# Patient Record
Sex: Male | Born: 1950 | Race: Black or African American | Hispanic: No | Marital: Married | Smoking: Current every day smoker
Health system: Southern US, Community
[De-identification: ages and names within clinical notes are randomized; demographics above are authoritative.]

## PROBLEM LIST (undated history)

## (undated) DIAGNOSIS — I10 Essential (primary) hypertension: Secondary | ICD-10-CM

## (undated) HISTORY — PX: FINGER SURGERY: SHX640

---

## 2011-10-22 ENCOUNTER — Encounter: Payer: Self-pay | Admitting: *Deleted

## 2011-10-22 ENCOUNTER — Emergency Department (HOSPITAL_COMMUNITY)
Admission: EM | Admit: 2011-10-22 | Discharge: 2011-10-23 | Disposition: A | Discharge status: Home / Self Care | Payer: Self-pay | Attending: Emergency Medicine | Admitting: Emergency Medicine

## 2011-10-22 DIAGNOSIS — F17200 Nicotine dependence, unspecified, uncomplicated: Secondary | ICD-10-CM | POA: Insufficient documentation

## 2011-10-22 DIAGNOSIS — R059 Cough, unspecified: Secondary | ICD-10-CM | POA: Insufficient documentation

## 2011-10-22 DIAGNOSIS — G43909 Migraine, unspecified, not intractable, without status migrainosus: Secondary | ICD-10-CM | POA: Insufficient documentation

## 2011-10-22 DIAGNOSIS — R05 Cough: Secondary | ICD-10-CM | POA: Insufficient documentation

## 2011-10-22 MED ORDER — PROMETHAZINE HCL 25 MG/ML IJ SOLN
12.5000 mg | Freq: Once | INTRAMUSCULAR | Status: AC
  Administered 2011-10-23: 12.5 mg via INTRAVENOUS
  Filled 2011-10-22: qty 1

## 2011-10-22 MED ORDER — SODIUM CHLORIDE 0.9 % IV SOLN: INTRAVENOUS | Status: DC

## 2011-10-22 MED ORDER — DEXAMETHASONE SODIUM PHOSPHATE 4 MG/ML IJ SOLN
10.0000 mg | Freq: Once | INTRAMUSCULAR | Status: AC
  Administered 2011-10-23: 10 mg via INTRAVENOUS
  Filled 2011-10-22: qty 3

## 2011-10-22 MED ORDER — DIPHENHYDRAMINE HCL 50 MG/ML IJ SOLN
25.0000 mg | Freq: Once | INTRAMUSCULAR | Status: AC
  Administered 2011-10-23: 25 mg via INTRAVENOUS
  Filled 2011-10-22: qty 1

## 2011-10-22 MED ORDER — SODIUM CHLORIDE 0.9 % IV BOLUS (SEPSIS)
1000.0000 mL | Freq: Once | INTRAVENOUS | Status: AC
  Administered 2011-10-23: 1000 mL via INTRAVENOUS

## 2011-10-22 NOTE — ED Notes (Signed)
Pt reports migraine headache on left side.  Reports headache started about 3 weeks ago, pain is intermittent.  Reports occasional blurred vision in left eye. Denies nausea or vomiting at present time.

## 2011-10-23 ENCOUNTER — Encounter (HOSPITAL_COMMUNITY): Payer: Self-pay | Admitting: Emergency Medicine

## 2011-10-23 NOTE — ED Provider Notes (Signed)
History     CSN: [id number] Arrival date & time: 10/22/2011 10:58 PM   First MD Initiated Contact with Patient 10/22/11 2313      Chief Complaint  Patient presents with  . Migraine    (Consider location/radiation/quality/duration/timing/severity/associated sxs/prior treatment) Patient is a 60 y.o. male presenting with migraine. The history is provided by the patient and the spouse.  Migraine This is a recurrent problem. The current episode started 3 to 5 hours ago. The problem occurs constantly. The problem has been resolved. Associated symptoms include headaches. Pertinent negatives include no chest pain, no abdominal pain and no shortness of breath. The symptoms are aggravated by nothing. The symptoms are relieved by nothing. Treatments tried: Excedrin Migraine. The treatment provided significant relief.   Patient with known history of migraines but having trouble recurrent migraines for the past 3 weeks today onset of migraine at 8:30 in the evening. Upon arrival to the emergency department headache resolved after taking his Excedrin Migraine. The headache is left temporal area with pain to the left thigh this is typical no nausea no vomiting. He is also on Imitrex he is followed in the [place] area by his doctor for headache. No other complaints  Past Medical History  Diagnosis Date  . Migraine     History reviewed. No pertinent past surgical history.  History reviewed. No pertinent family history.  History  Substance Use Topics  . Smoking status: Current Everyday Smoker    Types: Cigars  . Smokeless tobacco: Not on file  . Alcohol Use: No      Review of Systems  Constitutional: Negative for fever and chills.  HENT: Negative for congestion and neck pain.   Eyes: Positive for pain. Negative for photophobia, redness and visual disturbance.  Respiratory: Positive for cough. Negative for shortness of breath.   Cardiovascular: Negative for chest pain.  Gastrointestinal:  Negative for nausea, vomiting, abdominal pain and diarrhea.  Genitourinary: Negative for dysuria.  Musculoskeletal: Negative for back pain.  Neurological: Positive for headaches. Negative for facial asymmetry, speech difficulty, weakness and numbness.  Hematological: Does not bruise/bleed easily.  Psychiatric/Behavioral: Negative for confusion.    Allergies  Vancomycin  Home Medications   Current Outpatient Rx  Name Route Sig Dispense Refill  . ASPIRIN-ACETAMINOPHEN-CAFFEINE 250-250-65 MG PO TABS Oral Take 2 tablets by mouth as needed. For migraine pain     . SUMATRIPTAN SUCCINATE 50 MG PO TABS Oral Take 50 mg by mouth every 2 (two) hours as needed. For pain       BP 139/77  Pulse 52  Temp(Src) 98.3 F (36.8 C) (Oral)  Resp 16  Ht 5\' 10"  (1.778 m)  Wt 185 lb (83.915 kg)  BMI 26.54 kg/m2  SpO2 100%  Physical Exam  Nursing note and vitals reviewed. Constitutional: He is oriented to person, place, and time. He appears well-developed and well-nourished. No distress.  HENT:  Head: Normocephalic and atraumatic.  Mouth/Throat: Oropharynx is clear and moist.  Eyes: Conjunctivae and EOM are normal. Pupils are equal, round, and reactive to light.  Neck: Neck supple.  Cardiovascular: Normal rate, regular rhythm and normal heart sounds.   No murmur heard. Pulmonary/Chest: Effort normal.  Abdominal: Soft. Bowel sounds are normal. There is no tenderness.  Musculoskeletal: Normal range of motion.  Neurological: He is alert and oriented to person, place, and time. No cranial nerve deficit. He exhibits normal muscle tone. Coordination normal.  Skin: Skin is warm. No rash noted.    ED Course  Procedures (including  critical care time)  Labs Reviewed - No data to display No results found.   1. Migraine       MDM   Left-sided headache typical for this patient's migraine headaches they actually had resolved shortly before I got in to see him but he says they have been recurring  on and off all day so we decided to treat him with migraine cocktail anyways. The patient was given IV Decadron IV Benadryl IV Phenergan and one bolus of IV normal saline. Remained headache free and get some sleep. Patient is from the [place] area and is followed by his physician therefore his ha.         Shelda Jakes, MD 10/23/11 [phone]

## 2016-01-05 ENCOUNTER — Encounter (HOSPITAL_COMMUNITY): Payer: Self-pay

## 2016-01-05 ENCOUNTER — Emergency Department (HOSPITAL_COMMUNITY)
Admission: EM | Admit: 2016-01-05 | Discharge: 2016-01-05 | Disposition: A | Discharge status: Home / Self Care | Payer: Medicare Other | Attending: Emergency Medicine | Admitting: Emergency Medicine

## 2016-01-05 DIAGNOSIS — Z87891 Personal history of nicotine dependence: Secondary | ICD-10-CM | POA: Diagnosis not present

## 2016-01-05 DIAGNOSIS — G43009 Migraine without aura, not intractable, without status migrainosus: Secondary | ICD-10-CM | POA: Insufficient documentation

## 2016-01-05 DIAGNOSIS — Z79899 Other long term (current) drug therapy: Secondary | ICD-10-CM | POA: Insufficient documentation

## 2016-01-05 DIAGNOSIS — R51 Headache: Secondary | ICD-10-CM | POA: Diagnosis present

## 2016-01-05 LAB — I-STAT CHEM 8, ED
BUN: 11 mg/dL (ref 6–20)
CREATININE: 0.9 mg/dL (ref 0.61–1.24)
Calcium, Ion: 1.12 mmol/L — ABNORMAL LOW (ref 1.13–1.30)
Chloride: 104 mmol/L (ref 101–111)
Glucose, Bld: 108 mg/dL — ABNORMAL HIGH (ref 65–99)
HEMATOCRIT: 45 % (ref 39.0–52.0)
HEMOGLOBIN: 15.3 g/dL (ref 13.0–17.0)
POTASSIUM: 3.7 mmol/L (ref 3.5–5.1)
SODIUM: 141 mmol/L (ref 135–145)
TCO2: 24 mmol/L (ref 0–100)

## 2016-01-05 LAB — C-REACTIVE PROTEIN: CRP: <0.5 mg/dL (ref <1.0)

## 2016-01-05 LAB — SEDIMENTATION RATE: Sed Rate: 4 mm/hr (ref 0–16)

## 2016-01-05 MED ORDER — DEXAMETHASONE SODIUM PHOSPHATE 10 MG/ML IJ SOLN
10.0000 mg | Freq: Once | INTRAMUSCULAR | Status: AC
  Administered 2016-01-05: 10 mg via INTRAVENOUS
  Filled 2016-01-05: qty 1

## 2016-01-05 MED ORDER — DIPHENHYDRAMINE HCL 50 MG/ML IJ SOLN
25.0000 mg | Freq: Once | INTRAMUSCULAR | Status: AC
  Administered 2016-01-05: 25 mg via INTRAVENOUS
  Filled 2016-01-05: qty 1

## 2016-01-05 MED ORDER — PROCHLORPERAZINE EDISYLATE 5 MG/ML IJ SOLN
10.0000 mg | Freq: Once | INTRAMUSCULAR | Status: AC
  Administered 2016-01-05: 10 mg via INTRAVENOUS
  Filled 2016-01-05: qty 2

## 2016-01-05 MED ORDER — SODIUM CHLORIDE 0.9 % IV BOLUS (SEPSIS)
1000.0000 mL | Freq: Once | INTRAVENOUS | Status: AC
  Administered 2016-01-05: 1000 mL via INTRAVENOUS

## 2016-01-05 NOTE — ED Notes (Signed)
Pt reports headache onset last night and continuing this am, taking excedrin migraine without relief

## 2016-01-05 NOTE — ED Provider Notes (Signed)
CSN: [id number]     Arrival date & time 01/05/16  0415 History   First MD Initiated Contact with Patient 01/05/16 0425     Chief Complaint  Patient presents with  . Headache     (Consider location/radiation/quality/duration/timing/severity/associated sxs/prior Treatment) HPI  This is a 65 year old male who presents with headache. Patient reports headache worsening over the last 24 hours. He reports history of migraines "that I get every few years." He states that he has had a headache on and off since the beginning of February. Current headache starting yesterday. It is over the left temple. Currently 9 out of 10. Denies fever, neck pain, worst headache of his life. It is similar to prior headaches. He saw his primary physician but "he didn't do anything for me." He has been taking Excedrin migraine with minimal relief. Reports occasional blurred vision of the left eye. Reports photophobia. No nausea or vomiting.  Past Medical History  Diagnosis Date  . Migraine    History reviewed. No pertinent past surgical history. No family history on file. Social History  Substance Use Topics  . Smoking status: Former Smoker    Types: Cigars  . Smokeless tobacco: None  . Alcohol Use: No    Review of Systems  Constitutional: Negative for fever.  Eyes: Positive for photophobia and visual disturbance.  Gastrointestinal: Negative for nausea and vomiting.  Musculoskeletal: Negative for neck pain and neck stiffness.  Neurological: Positive for headaches.  All other systems reviewed and are negative.     Allergies  Vancomycin  Home Medications   Prior to Admission medications   Medication Sig Start Date End Date Taking? Authorizing Provider  aspirin-acetaminophen-caffeine (EXCEDRIN MIGRAINE) [phone] MG per tablet Take 2 tablets by mouth as needed. For migraine pain    Yes Historical Provider, MD  atorvastatin (LIPITOR) 40 MG tablet Take 40 mg by mouth daily.   Yes Historical Provider,  MD  lisinopril (PRINIVIL,ZESTRIL) 20 MG tablet Take 20 mg by mouth daily.   Yes Historical Provider, MD  SUMAtriptan (IMITREX) 50 MG tablet Take 50 mg by mouth every 2 (two) hours as needed. For pain     Historical Provider, MD   BP 137/81 mmHg  Pulse 87  Temp(Src) 97.4 F (36.3 C) (Oral)  Resp 20  Ht  (1.778 m)  Wt 182 lb (82.555 kg)  BMI 26.11 kg/m2  SpO2 97% Physical Exam  Constitutional: He is oriented to person, place, and time. He appears well-developed and well-nourished. No distress.  HENT:  Head: Normocephalic and atraumatic.  Mouth/Throat: Oropharynx is clear and moist.  Tenderness to palpation left temple  Eyes: EOM are normal. Pupils are equal, round, and reactive to light.  Neck: Neck supple.  Cardiovascular: Normal rate, regular rhythm and normal heart sounds.   No murmur heard. Pulmonary/Chest: Effort normal and breath sounds normal. No respiratory distress. He has no wheezes.  Musculoskeletal: He exhibits no edema.  Neurological: He is alert and oriented to person, place, and time.  Cranial nerves II through XII intact, 5 out of 5 strength in all 4's extremities, normal gait  Skin: Skin is warm and dry.  Psychiatric: He has a normal mood and affect.  Nursing note and vitals reviewed.   ED Course  Procedures (including critical care time) Labs Review Labs Reviewed  I-STAT CHEM 8, ED - Abnormal; Notable for the following:    Glucose, Bld 108 (*)    Calcium, Ion 1.12 (*)    All other components within normal limits  SEDIMENTATION RATE  C-REACTIVE PROTEIN    Imaging Review No results found. I have personally reviewed and evaluated these images and lab results as part of my medical decision-making.   EKG Interpretation None      MDM   Final diagnoses:  Migraine without aura and without status migrainosus, not intractable    Patient presents with headache. History of migraine headaches. Headache is refractory to outpatient medications. Of  note, patient reports most of his pain is over his left temporal and he does have tenderness to palpation there. Given his age, temporal arteritis would also be a consideration. Doubt meningitis or subarachnoid hemorrhage. Patient was given migraine cocktail. Lab work including sedimentation rate and CRP were obtained. Chem-8 and sedimentation rate are essentially normal. On recheck, patient reports marked improvement of his pain with a migraine cocktail. He is requesting discharge home. With a normal sedimentation rate, doubt temporal arteritis. We'll have him follow-up with his primary physician.  After history, exam, and medical workup I feel the patient has been appropriately medically screened and is safe for discharge home. Pertinent diagnoses were discussed with the patient. Patient was given return precautions.     Courtney Shon Baton2/24/17 [phone]

## 2016-01-05 NOTE — Discharge Instructions (Signed)

## 2016-06-30 ENCOUNTER — Emergency Department (HOSPITAL_COMMUNITY)
Admission: EM | Admit: 2016-06-30 | Discharge: 2016-06-30 | Disposition: A | Discharge status: Home / Self Care | Payer: Medicare Other | Source: Home / Self Care | Attending: Emergency Medicine | Admitting: Emergency Medicine

## 2016-06-30 ENCOUNTER — Encounter (HOSPITAL_COMMUNITY): Payer: Self-pay | Admitting: *Deleted

## 2016-06-30 DIAGNOSIS — R51 Headache: Secondary | ICD-10-CM | POA: Insufficient documentation

## 2016-06-30 DIAGNOSIS — Z79899 Other long term (current) drug therapy: Secondary | ICD-10-CM | POA: Insufficient documentation

## 2016-06-30 DIAGNOSIS — I10 Essential (primary) hypertension: Secondary | ICD-10-CM | POA: Insufficient documentation

## 2016-06-30 DIAGNOSIS — R319 Hematuria, unspecified: Secondary | ICD-10-CM | POA: Insufficient documentation

## 2016-06-30 DIAGNOSIS — R05 Cough: Secondary | ICD-10-CM

## 2016-06-30 DIAGNOSIS — Z87891 Personal history of nicotine dependence: Secondary | ICD-10-CM | POA: Insufficient documentation

## 2016-06-30 DIAGNOSIS — Z79891 Long term (current) use of opiate analgesic: Secondary | ICD-10-CM

## 2016-06-30 DIAGNOSIS — R509 Fever, unspecified: Secondary | ICD-10-CM

## 2016-06-30 LAB — COMPREHENSIVE METABOLIC PANEL
ALBUMIN: 3.4 g/dL — AB (ref 3.5–5.0)
ALT: 88 U/L — ABNORMAL HIGH (ref 17–63)
ANION GAP: 6 (ref 5–15)
AST: 145 U/L — ABNORMAL HIGH (ref 15–41)
Alkaline Phosphatase: 135 U/L — ABNORMAL HIGH (ref 38–126)
BILIRUBIN TOTAL: 0.7 mg/dL (ref 0.3–1.2)
BUN: 12 mg/dL (ref 6–20)
CO2: 24 mmol/L (ref 22–32)
Calcium: 8.3 mg/dL — ABNORMAL LOW (ref 8.9–10.3)
Chloride: 99 mmol/L — ABNORMAL LOW (ref 101–111)
Creatinine, Ser: 1.01 mg/dL (ref 0.61–1.24)
Glucose, Bld: 185 mg/dL — ABNORMAL HIGH (ref 65–99)
POTASSIUM: 3.4 mmol/L — AB (ref 3.5–5.1)
Sodium: 129 mmol/L — ABNORMAL LOW (ref 135–145)
TOTAL PROTEIN: 6.6 g/dL (ref 6.5–8.1)

## 2016-06-30 LAB — URINALYSIS, ROUTINE W REFLEX MICROSCOPIC
GLUCOSE, UA: 100 mg/dL — AB
HGB URINE DIPSTICK: NEGATIVE
KETONES UR: NEGATIVE mg/dL
Leukocytes, UA: NEGATIVE
NITRITE: NEGATIVE
PH: 6 (ref 5.0–8.0)
Protein, ur: 30 mg/dL — AB
Specific Gravity, Urine: 1.015 (ref 1.005–1.030)

## 2016-06-30 LAB — CBC WITH DIFFERENTIAL/PLATELET
BASOS PCT: 0 %
Basophils Absolute: 0 10*3/uL (ref 0.0–0.1)
Eosinophils Absolute: 0 10*3/uL (ref 0.0–0.7)
Eosinophils Relative: 0 %
HEMATOCRIT: 41.2 % (ref 39.0–52.0)
Hemoglobin: 13.8 g/dL (ref 13.0–17.0)
LYMPHS ABS: 0.4 10*3/uL — AB (ref 0.7–4.0)
LYMPHS PCT: 14 %
MCH: 26.4 pg (ref 26.0–34.0)
MCHC: 33.5 g/dL (ref 30.0–36.0)
MCV: 78.8 fL (ref 78.0–100.0)
Monocytes Absolute: 0.1 10*3/uL (ref 0.1–1.0)
Monocytes Relative: 2 %
NEUTROS PCT: 84 %
Neutro Abs: 2.4 10*3/uL (ref 1.7–7.7)
Platelets: 160 10*3/uL (ref 150–400)
RBC: 5.23 MIL/uL (ref 4.22–5.81)
RDW: 14.4 % (ref 11.5–15.5)
WBC: 2.9 10*3/uL — AB (ref 4.0–10.5)

## 2016-06-30 LAB — URINE MICROSCOPIC-ADD ON

## 2016-06-30 MED ORDER — ACETAMINOPHEN 500 MG PO TABS
1000.0000 mg | ORAL_TABLET | Freq: Once | ORAL | Status: AC
  Administered 2016-06-30: 1000 mg via ORAL
  Filled 2016-06-30: qty 2

## 2016-06-30 NOTE — Discharge Instructions (Signed)
Alternate Tylenol and Motrin every 4 hours for fever over 101. Drink plenty of fluids (non alcoholic) Your liver tests were abnormal - likely from drinking alcohol - please reduce the amount that you drink Seek medical attention for severe or worsening symptoms.

## 2016-06-30 NOTE — ED Triage Notes (Signed)
Pt c/o fever x 2 days with right lower groin pain that is aggravated at times when walking; pt denies any n/v/d or urinary sx

## 2016-06-30 NOTE — ED Notes (Signed)
MD at bedside. 

## 2016-06-30 NOTE — ED Triage Notes (Signed)
Pt was seen at [facility] on 8-18 and told he has bronchitis and possible kidney stones; pt states he does not feel any better

## 2016-06-30 NOTE — ED Provider Notes (Signed)
[facility]-EMERGENCY DEPT Provider Note   CSN: [id number] Arrival date & time: 06/30/16  0605     History   Chief Complaint Chief Complaint  Patient presents with  . Fever    HPI Kevin Odom is a 65 y.o. male.  The patient is a 65 year old male, he has a history of hypertension and hypercholesterolemia, he states that he has been sick for several days with fever, occasional coughing and some lower right abdominal pain for which she was seen at [facility], he reports that while he was there he had x-rays, CT scan, blood work and was told that he had bronchitis and a kidney stone. He states that over the last 3 days since his symptoms started he has had persistent fluctuating fever, occasional coughing, states that he feels like he is just not getting better despite being prescribed Zithromax for his bronchitis and cough syrup. He denies any pain in his scrotum or testicles, he denies any back pain, he denies any vomiting or diarrhea. He has been able to eat and drink without difficulty. He also reports that his head is hurting when his hair grows in. He states it did not used to feel that way but he does not shave his head anymore and now his scalp hurts to the touch. He denies rashes or redness of his scalp.      Past Medical History:  Diagnosis Date  . Migraine     There are no active problems to display for this patient.   Past Surgical History:  Procedure Laterality Date  . FINGER SURGERY Right        Home Medications    Prior to Admission medications   Medication Sig Start Date End Date Taking? Authorizing Provider  atorvastatin (LIPITOR) 40 MG tablet Take 40 mg by mouth daily.   Yes Historical Provider, MD  azithromycin (ZITHROMAX) 250 MG tablet Take by mouth daily.   Yes Historical Provider, MD  Cholecalciferol (VITAMIN D-3) 1000 units CAPS Take 2 capsules by mouth.   Yes Historical Provider, MD  cyclobenzaprine (FLEXERIL) 10 MG tablet Take 10 mg by  mouth 3 (three) times daily as needed for muscle spasms.   Yes Historical Provider, MD  HYDROcodone-acetaminophen (NORCO/VICODIN) 5-325 MG tablet Take 1 tablet by mouth every 6 (six) hours as needed for moderate pain.   Yes Historical Provider, MD  lisinopril-hydrochlorothiazide (PRINZIDE,ZESTORETIC) 20-12.5 MG tablet Take 1 tablet by mouth daily.   Yes Historical Provider, MD  PROMETHAZINE-DM PO Take 10 mLs by mouth every 6 (six) hours.   Yes Historical Provider, MD  Vitamin D, Ergocalciferol, (DRISDOL) 50000 units CAPS capsule Take 50,000 Units by mouth every 7 (seven) days.   Yes Historical Provider, MD  aspirin-acetaminophen-caffeine (EXCEDRIN MIGRAINE) [phone] MG per tablet Take 2 tablets by mouth as needed. For migraine pain     Historical Provider, MD  SUMAtriptan (IMITREX) 50 MG tablet Take 50 mg by mouth every 2 (two) hours as needed. For pain     Historical Provider, MD    Family History History reviewed. No pertinent family history.  Social History Social History  Substance Use Topics  . Smoking status: Former Smoker    Types: Cigars  . Smokeless tobacco: Never Used  . Alcohol use No     Allergies   Vancomycin   Review of Systems Review of Systems  Constitutional: Positive for fever. Negative for chills.  HENT: Negative for sore throat.   Eyes: Negative for visual disturbance.  Respiratory: Positive for cough.  Negative for shortness of breath.   Cardiovascular: Negative for chest pain.  Gastrointestinal: Negative for abdominal pain, diarrhea, nausea and vomiting.  Genitourinary: Positive for hematuria. Negative for dysuria and frequency.  Musculoskeletal: Negative for back pain and neck pain.  Skin: Negative for rash.  Neurological: Positive for headaches. Negative for weakness and numbness.  Hematological: Negative for adenopathy.  Psychiatric/Behavioral: Negative for behavioral problems.     Physical Exam Updated Vital Signs BP 112/67   Pulse 66   Temp  98.2 F (36.8 C) (Oral)   Resp 16   Ht 5\' 10"  (1.778 m)   Wt 173 lb (78.5 kg)   SpO2 98%   BMI 24.82 kg/m   Physical Exam  Constitutional: He appears well-developed and well-nourished. No distress.  HENT:  Head: Normocephalic and atraumatic.  Mouth/Throat: Oropharynx is clear and moist. No oropharyngeal exudate.  Oropharynx is clear and moist, normal mucous membranes, no exudate asymmetry or hypertrophy, uvula is midline. Normal appearing scalp, no signs of rash, no abscess, no obvious asymmetry or abnormal appearance of the skin, no tenderness to my palpation  Eyes: Conjunctivae and EOM are normal. Pupils are equal, round, and reactive to light. Right eye exhibits no discharge. Left eye exhibits no discharge. No scleral icterus.  Neck: Normal range of motion. Neck supple. No JVD present. No thyromegaly present.  Very supple neck without lymphadenopathy or thyromegaly  Cardiovascular: Normal rate, regular rhythm, normal heart sounds and intact distal pulses.  Exam reveals no gallop and no friction rub.   No murmur heard. No tachycardia, strong peripheral pulses  Pulmonary/Chest: Effort normal and breath sounds normal. No respiratory distress. He has no wheezes. He has no rales.  Abdominal: Soft. Bowel sounds are normal. He exhibits no distension and no mass. There is no tenderness.  No abdominal tenderness to palpation, no masses in the inguinal area  Musculoskeletal: Normal range of motion. He exhibits no edema or tenderness.  Lymphadenopathy:    He has no cervical adenopathy.  Neurological: He is alert. Coordination normal.  Skin: Skin is warm and dry. No rash noted. No erythema.  Psychiatric: He has a normal mood and affect. His behavior is normal.  Nursing note and vitals reviewed.    ED Treatments / Results  Labs (all labs ordered are listed, but only abnormal results are displayed) Labs Reviewed  CBC WITH DIFFERENTIAL/PLATELET - Abnormal; Notable for the following:        Result Value   WBC 2.9 (*)    Lymphs Abs 0.4 (*)    All other components within normal limits  URINALYSIS, ROUTINE W REFLEX MICROSCOPIC (NOT AT [facility]) - Abnormal; Notable for the following:    Color, Urine AMBER (*)    APPearance HAZY (*)    Glucose, UA 100 (*)    Bilirubin Urine SMALL (*)    Protein, ur 30 (*)    All other components within normal limits  COMPREHENSIVE METABOLIC PANEL - Abnormal; Notable for the following:    Sodium 129 (*)    Potassium 3.4 (*)    Chloride 99 (*)    Glucose, Bld 185 (*)    Calcium 8.3 (*)    Albumin 3.4 (*)    AST 145 (*)    ALT 88 (*)    Alkaline Phosphatase 135 (*)    All other components within normal limits  URINE MICROSCOPIC-ADD ON - Abnormal; Notable for the following:    Squamous Epithelial / LPF 0-5 (*)    Bacteria, UA MANY (*)  All other components within normal limits  URINE CULTURE    EKG  EKG Interpretation None       Radiology No results found.  Procedures Procedures (including critical care time)  Medications Ordered in ED Medications  acetaminophen (TYLENOL) tablet 1,000 mg (1,000 mg Oral Given 06/30/16 0717)     Initial Impression / Assessment and Plan / ED Course  I have reviewed the triage vital signs and the nursing notes.  Pertinent labs & imaging results that were available during my care of the patient were reviewed by me and considered in my medical decision making (see chart for details).  Clinical Course  Comment By Time  The patient is well-appearing, he will need to have some basic labs drawn to make sure there is no change in his baseline however his heart rate is 70, temperature is 98.2, blood pressure is 123/68, oxygen of 99% and his lungs are clear on room air. At this time I will obtain his records from the other hospital, will give him Tylenol, check a urinalysis as it does appear to have a dark appearance. We'll need to rule out infection, this could be hematuria from a kidney stone. He has a  full bottle of cough syrup which she has not been using that much of and it seems that he may have a bronchitis, the etiology of this infection is likely viral however imaging will be obtained from the outside hospital to further elucidate the source of that cough. He is well-appearing, he is not hypotensive, there is no signs of systemic inflammatory response syndrome Eber HongBrian Jemarion Roycroft, MD 08/20 [phone]  Pt and family informed of results - he now states that he drinks beer daily - this would explain the transaminitis - pt cautioned about this and encouraged to stop drinking - will seek PCP guidance in this issue - otherwise pt is well appaering, no fever and stabel for d/c. Eber HongBrian Wajiha Versteeg, MD 08/20 [phone]      Final Clinical Impressions(s) / ED Diagnoses   Final diagnoses:  Fever, unspecified fever cause    New Prescriptions New Prescriptions   No medications on file     Eber HongBrian Shamarion Coots, MD 06/30/16 [phone]

## 2016-07-01 ENCOUNTER — Inpatient Hospital Stay (HOSPITAL_COMMUNITY)
Admission: EM | Admit: 2016-07-01 | Discharge: 2016-07-03 | DRG: 640 | Discharge status: Against Medical Advice | Payer: Medicare Other | Attending: Internal Medicine | Admitting: Internal Medicine

## 2016-07-01 ENCOUNTER — Encounter (HOSPITAL_COMMUNITY): Payer: Self-pay | Admitting: Emergency Medicine

## 2016-07-01 ENCOUNTER — Emergency Department (HOSPITAL_COMMUNITY): Payer: Medicare Other

## 2016-07-01 DIAGNOSIS — E876 Hypokalemia: Secondary | ICD-10-CM | POA: Diagnosis present

## 2016-07-01 DIAGNOSIS — E860 Dehydration: Secondary | ICD-10-CM | POA: Diagnosis present

## 2016-07-01 DIAGNOSIS — G9340 Encephalopathy, unspecified: Secondary | ICD-10-CM | POA: Diagnosis present

## 2016-07-01 DIAGNOSIS — I10 Essential (primary) hypertension: Secondary | ICD-10-CM | POA: Diagnosis present

## 2016-07-01 DIAGNOSIS — R748 Abnormal levels of other serum enzymes: Secondary | ICD-10-CM

## 2016-07-01 DIAGNOSIS — Z7982 Long term (current) use of aspirin: Secondary | ICD-10-CM

## 2016-07-01 DIAGNOSIS — R7989 Other specified abnormal findings of blood chemistry: Secondary | ICD-10-CM | POA: Diagnosis not present

## 2016-07-01 DIAGNOSIS — E7800 Pure hypercholesterolemia, unspecified: Secondary | ICD-10-CM | POA: Diagnosis present

## 2016-07-01 DIAGNOSIS — E869 Volume depletion, unspecified: Secondary | ICD-10-CM | POA: Diagnosis present

## 2016-07-01 DIAGNOSIS — Z79891 Long term (current) use of opiate analgesic: Secondary | ICD-10-CM | POA: Diagnosis not present

## 2016-07-01 DIAGNOSIS — R410 Disorientation, unspecified: Secondary | ICD-10-CM | POA: Diagnosis present

## 2016-07-01 DIAGNOSIS — R4182 Altered mental status, unspecified: Secondary | ICD-10-CM | POA: Diagnosis not present

## 2016-07-01 DIAGNOSIS — F1010 Alcohol abuse, uncomplicated: Secondary | ICD-10-CM | POA: Diagnosis present

## 2016-07-01 DIAGNOSIS — R945 Abnormal results of liver function studies: Secondary | ICD-10-CM

## 2016-07-01 DIAGNOSIS — E785 Hyperlipidemia, unspecified: Secondary | ICD-10-CM | POA: Diagnosis present

## 2016-07-01 DIAGNOSIS — E871 Hypo-osmolality and hyponatremia: Principal | ICD-10-CM | POA: Diagnosis present

## 2016-07-01 LAB — HEPATIC FUNCTION PANEL
ALT: 90 U/L — ABNORMAL HIGH (ref 17–63)
AST: 153 U/L — ABNORMAL HIGH (ref 15–41)
Albumin: 3.4 g/dL — ABNORMAL LOW (ref 3.5–5.0)
Alkaline Phosphatase: 252 U/L — ABNORMAL HIGH (ref 38–126)
Bilirubin, Direct: 1.3 mg/dL — ABNORMAL HIGH (ref 0.1–0.5)
Indirect Bilirubin: 0.7 mg/dL (ref 0.3–0.9)
Total Bilirubin: 2 mg/dL — ABNORMAL HIGH (ref 0.3–1.2)
Total Protein: 6.5 g/dL (ref 6.5–8.1)

## 2016-07-01 LAB — NA AND K (SODIUM & POTASSIUM), RAND UR
POTASSIUM UR: 15 mmol/L
Sodium, Ur: 24 mmol/L

## 2016-07-01 LAB — BASIC METABOLIC PANEL
ANION GAP: 6 (ref 5–15)
Anion gap: 5 (ref 5–15)
BUN: 15 mg/dL (ref 6–20)
BUN: 15 mg/dL (ref 6–20)
CALCIUM: 7.6 mg/dL — AB (ref 8.9–10.3)
CO2: 25 mmol/L (ref 22–32)
CO2: 25 mmol/L (ref 22–32)
Calcium: 7.9 mg/dL — ABNORMAL LOW (ref 8.9–10.3)
Chloride: 94 mmol/L — ABNORMAL LOW (ref 101–111)
Chloride: 94 mmol/L — ABNORMAL LOW (ref 101–111)
Creatinine, Ser: 0.96 mg/dL (ref 0.61–1.24)
Creatinine, Ser: 0.85 mg/dL (ref 0.61–1.24)
GFR calc Af Amer: >60 mL/min (ref >60)
GFR calc Af Amer: >60 mL/min (ref >60)
GFR calc non Af Amer: >60 mL/min (ref >60)
GLUCOSE: 100 mg/dL — AB (ref 65–99)
Glucose, Bld: 113 mg/dL — ABNORMAL HIGH (ref 65–99)
POTASSIUM: 3.4 mmol/L — AB (ref 3.5–5.1)
Potassium: 3.5 mmol/L (ref 3.5–5.1)
SODIUM: 125 mmol/L — AB (ref 135–145)
Sodium: 124 mmol/L — ABNORMAL LOW (ref 135–145)

## 2016-07-01 LAB — CBC WITH DIFFERENTIAL/PLATELET
Basophils Absolute: 0.1 10*3/uL (ref 0.0–0.1)
Basophils Relative: 4 %
Eosinophils Absolute: 0 10*3/uL (ref 0.0–0.7)
Eosinophils Relative: 0 %
HCT: 39.2 % (ref 39.0–52.0)
Hemoglobin: 13.7 g/dL (ref 13.0–17.0)
Lymphocytes Relative: 29 %
Lymphs Abs: 0.8 10*3/uL (ref 0.7–4.0)
MCH: 27 pg (ref 26.0–34.0)
MCHC: 34.9 g/dL (ref 30.0–36.0)
MCV: 77.2 fL — ABNORMAL LOW (ref 78.0–100.0)
Monocytes Absolute: 0.2 10*3/uL (ref 0.1–1.0)
Monocytes Relative: 6 %
Neutro Abs: 1.7 10*3/uL (ref 1.7–7.7)
Neutrophils Relative %: 61 %
Platelets: 122 10*3/uL — ABNORMAL LOW (ref 150–400)
RBC: 5.08 MIL/uL (ref 4.22–5.81)
RDW: 14.3 % (ref 11.5–15.5)
WBC: 2.8 10*3/uL — ABNORMAL LOW (ref 4.0–10.5)

## 2016-07-01 LAB — CREATININE, URINE, RANDOM: Creatinine, Urine: 50.91 mg/dL

## 2016-07-01 LAB — TSH: TSH: 0.694 u[IU]/mL (ref 0.350–4.500)

## 2016-07-01 LAB — URIC ACID: Uric Acid, Serum: 3 mg/dL — ABNORMAL LOW (ref 4.4–7.6)

## 2016-07-01 MED ORDER — ATORVASTATIN CALCIUM 40 MG PO TABS
40.0000 mg | ORAL_TABLET | Freq: Every day | ORAL | Status: DC
  Administered 2016-07-02: 40 mg via ORAL
  Filled 2016-07-01: qty 1

## 2016-07-01 MED ORDER — ENSURE ENLIVE PO LIQD: 237.0000 mL | Freq: Two times a day (BID) | ORAL | Status: DC

## 2016-07-01 MED ORDER — SODIUM CHLORIDE 0.9 % IV SOLN
INTRAVENOUS | Status: DC
  Administered 2016-07-01: 150 mL/h via INTRAVENOUS
  Administered 2016-07-02 – 2016-07-03 (×3): via INTRAVENOUS

## 2016-07-01 MED ORDER — HEPARIN SODIUM (PORCINE) 5000 UNIT/ML IJ SOLN
5000.0000 [IU] | Freq: Three times a day (TID) | INTRAMUSCULAR | Status: DC
  Administered 2016-07-01 – 2016-07-03 (×5): 5000 [IU] via SUBCUTANEOUS
  Filled 2016-07-01 (×5): qty 1

## 2016-07-01 MED ORDER — ASPIRIN EC 81 MG PO TBEC
81.0000 mg | DELAYED_RELEASE_TABLET | Freq: Every day | ORAL | Status: DC
  Administered 2016-07-02 – 2016-07-03 (×2): 81 mg via ORAL
  Filled 2016-07-01 (×2): qty 1

## 2016-07-01 MED ORDER — SODIUM CHLORIDE 0.9% FLUSH
3.0000 mL | Freq: Two times a day (BID) | INTRAVENOUS | Status: DC
  Administered 2016-07-02: 3 mL via INTRAVENOUS

## 2016-07-01 NOTE — ED Triage Notes (Signed)
Patient states he was outside cutting the grass when he became confused and had slurred speech. States he has continuing symptoms upon arrival to ER.

## 2016-07-01 NOTE — ED Provider Notes (Signed)
[facility]Y DEPT Provider Note   CSN: [id number] Arrival date & time: 07/01/16  1824     History   Chief Complaint Chief Complaint  Patient presents with  . Aphasia  . Altered Mental Status    HPI Kevin Odom is a 65 y.o. male.   HPI   Beer drinker with medical history significant of HTN and hypercholesterolemia, presents with complaints of confusion that onset earlier today. Pt was cutting the grass earlier today in the severe heat,when he started to become confused along with having slurred speech. Pt reports consuming a few beers several days ago. He is also noted to be a little confused but he is aware of the confusion. He admits to having some nausea, but no vomiting. He was seen in the ER yesterday as well.   He was given hydrocodone for his back pain, and he hadn't been on this previously.  He denied focal weakness or any focal neurological symptoms.   Past Medical History:  Diagnosis Date  . Migraine     There are no active problems to display for this patient.   Past Surgical History:  Procedure Laterality Date  . FINGER SURGERY Right        Home Medications    Prior to Admission medications   Medication Sig Start Date End Date Taking? Authorizing Provider  aspirin-acetaminophen-caffeine (EXCEDRIN MIGRAINE) [phone] MG per tablet Take 2 tablets by mouth as needed. For migraine pain     Historical Provider, MD  atorvastatin (LIPITOR) 40 MG tablet Take 40 mg by mouth daily.    Historical Provider, MD  azithromycin (ZITHROMAX) 250 MG tablet Take by mouth daily.    Historical Provider, MD  Cholecalciferol (VITAMIN D-3) 1000 units CAPS Take 2 capsules by mouth.    Historical Provider, MD  cyclobenzaprine (FLEXERIL) 10 MG tablet Take 10 mg by mouth 3 (three) times daily as needed for muscle spasms.    Historical Provider, MD  HYDROcodone-acetaminophen (NORCO/VICODIN) 5-325 MG tablet Take 1 tablet by mouth every 6 (six) hours as needed for moderate pain.     Historical Provider, MD  lisinopril-hydrochlorothiazide (PRINZIDE,ZESTORETIC) 20-12.5 MG tablet Take 1 tablet by mouth daily.    Historical Provider, MD  PROMETHAZINE-DM PO Take 10 mLs by mouth every 6 (six) hours.    Historical Provider, MD  SUMAtriptan (IMITREX) 50 MG tablet Take 50 mg by mouth every 2 (two) hours as needed. For pain     Historical Provider, MD  Vitamin D, Ergocalciferol, (DRISDOL) 50000 units CAPS capsule Take 50,000 Units by mouth every 7 (seven) days.    Historical Provider, MD    Family History History reviewed. No pertinent family history.  Social History Social History  Substance Use Topics  . Smoking status: Current Every Day Smoker    Types: Cigars, Cigarettes  . Smokeless tobacco: Never Used  . Alcohol use No     Comment: "I drank one or two beers a day but quit 3 days ago."     Allergies   Vancomycin   Review of Systems Review of Systems  All systems reviewed and negative, other than as noted in HPI.   Physical Exam Updated Vital Signs BP 131/71 (BP Location: Left Arm)   Pulse 87   Temp 100.1 F (37.8 C) (Oral)   Resp 23   Ht 5\' 10"  (1.778 m)   Wt 163 lb (73.9 kg)   SpO2 95%   BMI 23.39 kg/m   Physical Exam  Constitutional: He appears well-developed and well-nourished.  No distress.  HENT:  Head: Normocephalic and atraumatic.  Eyes: Conjunctivae are normal. Right eye exhibits no discharge. Left eye exhibits no discharge.  Neck: Neck supple.  Cardiovascular: Normal rate, regular rhythm and normal heart sounds.  Exam reveals no gallop and no friction rub.   No murmur heard. Pulmonary/Chest: Effort normal and breath sounds normal. No respiratory distress.  Abdominal: Soft. He exhibits no distension. There is no tenderness.  Musculoskeletal: He exhibits no edema or tenderness.  Neurological: He is alert. He displays normal reflexes. No cranial nerve deficit. Coordination normal.  Skin: Skin is warm and dry.  Psychiatric: He has a normal  mood and affect. His behavior is normal. Thought content normal.  Nursing note and vitals reviewed.    ED Treatments / Results  Labs (all labs ordered are listed, but only abnormal results are displayed) Labs Reviewed  CBC WITH DIFFERENTIAL/PLATELET - Abnormal; Notable for the following:       Result Value   WBC 2.8 (*)    MCV 77.2 (*)    Platelets 122 (*)    All other components within normal limits  BASIC METABOLIC PANEL - Abnormal; Notable for the following:    Sodium 124 (*)    Chloride 94 (*)    Glucose, Bld 113 (*)    Calcium 7.9 (*)    All other components within normal limits  URINALYSIS, ROUTINE W REFLEX MICROSCOPIC (NOT AT [facility])    EKG  EKG Interpretation  Date/Time:  Monday July 01 2016 18:32:30 EDT Ventricular Rate:  87 PR Interval:    QRS Duration: 98 QT Interval:  336 QTC Calculation: 405 R Axis:   52 Text Interpretation:  Sinus rhythm Consider right atrial enlargement No old tracing to compare Confirmed by Juleen China  MD, Cheria Sadiq [phone]) on 07/01/2016 7:03:25 PM       Radiology Dg Chest 2 View  Result Date: 07/01/2016 CLINICAL DATA:  Dysarthria with confusion and weakness. EXAM: CHEST  2 VIEW COMPARISON:  None. FINDINGS: The heart size and mediastinal contours are within normal limits. Both lungs are clear. The visualized skeletal structures are unremarkable. IMPRESSION: Normal chest. Electronically Signed   By: Francene Boyers M.D.   On: 07/01/2016 19:30   Ct Head Wo Contrast  Result Date: 07/01/2016 CLINICAL DATA:  Confusion, slurred speech EXAM: CT HEAD WITHOUT CONTRAST TECHNIQUE: Contiguous axial images were obtained from the base of the skull through the vertex without intravenous contrast. COMPARISON:  None. FINDINGS: Brain: No evidence of acute infarction, hemorrhage, hydrocephalus, extra-axial collection or mass lesion/mass effect. Vascular: No hyperdense vessel or unexpected calcification. Skull: No evidence of calvarial fracture. Sinuses/Orbits: The  visualized paranasal sinuses are essentially clear. The mastoid air cells are unopacified. Old left medial orbital wall fracture. Other: Subcortical white matter and periventricular small vessel ischemic changes, particularly in the subcortical left frontal lobe (series 2/image 23). IMPRESSION: No evidence of acute intracranial abnormality. Mild small vessel ischemic changes. Electronically Signed   By: Charline Bills M.D.   On: 07/01/2016 19:41    Procedures Procedures (including critical care time)  Medications Ordered in ED Medications - No data to display   Initial Impression / Assessment and Plan / ED Course  I have reviewed the triage vital signs and the nursing notes.  Pertinent labs & imaging results that were available during my care of the patient were reviewed by me and considered in my medical decision making (see chart for details).  Clinical Course    65 year old male with some mild  encephalopathy and aphasia which has since resolved. He is hyponatremic which is an acute dropFrom his very recent previous ER visit. Suspect this is the etiology of his symptoms.  Final Clinical Impressions(s) / ED Diagnoses   Final diagnoses:  Hyponatremia    New Prescriptions New Prescriptions   No medications on file     Raeford RazorStephen Caylan Schifano, MD 07/18/16 1530

## 2016-07-01 NOTE — H&P (Signed)
History and Physical    Kevin Odom YQM:[id number] DOB: [birth year] DOA: 07/01/2016  Referring MD/NP/PA: Raeford RazorStephen Kohut, MD PCP: No PCP Per Patient Outpatient Specialists: None Patient coming from: Home  Chief Complaint: Acute encephalopathy  HPI: Kevin Odom is a 65 y.o. male beer drinker with medical history significant of HTN and hypercholesterolemia, presents with complaints of confusion that onset earlier today. Pt was cutting the grass earlier today in the severe heat,when he started to become confused along with having slurred speech. Pt reports consuming a few beers several days ago. He is also noted to be a little confused but he is aware of the confusion. He admits to having some nausea, but no vomiting. He was seen in the ER yesterday as well.   He was given hydrocodone for his back pain, and he hadn't been on this previously.  He denied focal weakness or any focal neurological symptoms.  In the ER, his speech is more fluent, and he is less confused.    ED Course: While in the ED, febrile, sodium 124, WBC 2.8, Platelets 122, Glucose 113. Urine culture in process. CT head unremarkable. CXR unremarkable. EKG showed sinus rhythm. He will be admitted for further management.  Review of Systems: As per HPI otherwise 10 point review of systems negative.    Past Medical History:  Diagnosis Date  . Migraine     Past Surgical History:  Procedure Laterality Date  . FINGER SURGERY Right      reports that he has been smoking Cigars and Cigarettes.  He has never used smokeless tobacco. He reports that he does not drink alcohol or use drugs.  Allergies  Allergen Reactions  . Vancomycin Anaphylaxis    History reviewed. No pertinent family history.   Prior to Admission medications   Medication Sig Start Date End Date Taking? Authorizing Provider  aspirin EC 81 MG tablet Take 81 mg by mouth daily.   Yes Historical Provider, MD  atorvastatin (LIPITOR) 40 MG tablet Take 40 mg by mouth  daily.   Yes Historical Provider, MD  azithromycin (ZITHROMAX) 250 MG tablet Take 250-500 mg by mouth daily. 2 tablets on day 1 starting on 06/28/16, then take one tablet on days 2 through 5   Yes Historical Provider, MD  Cholecalciferol (VITAMIN D) 2000 units CAPS Take 1 capsule by mouth daily.   Yes Historical Provider, MD  cyclobenzaprine (FLEXERIL) 10 MG tablet Take 10 mg by mouth 3 (three) times daily as needed for muscle spasms.   Yes Historical Provider, MD  HYDROcodone-acetaminophen (NORCO/VICODIN) 5-325 MG tablet Take 1 tablet by mouth every 6 (six) hours as needed for moderate pain.   Yes Historical Provider, MD  lisinopril-hydrochlorothiazide (PRINZIDE,ZESTORETIC) 20-12.5 MG tablet Take 1 tablet by mouth daily.   Yes Historical Provider, MD  Vitamin D, Ergocalciferol, (DRISDOL) 50000 units CAPS capsule Take 50,000 Units by mouth every Monday.    Yes Historical Provider, MD    Physical Exam: Vitals:   07/01/16 1900 07/01/16 1930 07/01/16 2000 07/01/16 2030  BP: 125/68 140/74 132/73 128/77  Pulse: 90  86 78  Resp: (!) 27  23 26   Temp:      TempSrc:      SpO2: 99%  90% 97%  Weight:      Height:          Constitutional: NAD, calm, comfortable Vitals:   07/01/16 1900 07/01/16 1930 07/01/16 2000 07/01/16 2030  BP: 125/68 140/74 132/73 128/77  Pulse: 90  86 78  Resp: Marland Kitchen(!)  27  23 26   Temp:      TempSrc:      SpO2: 99%  90% 97%  Weight:      Height:       Eyes: PERRL, lids and conjunctivae normal ENMT: Mucous membranes are moist. Posterior pharynx clear of any exudate or lesions.Normal dentition.  Neck: normal, supple, no masses, no thyromegaly Respiratory: clear to auscultation bilaterally, no wheezing, no crackles. Normal respiratory effort. No accessory muscle use.  Cardiovascular: Regular rate and rhythm, no murmurs / rubs / gallops. No extremity edema. 2+ pedal pulses. No carotid bruits.  Abdomen: no tenderness, no masses palpated. No hepatosplenomegaly. Bowel sounds  positive.  Musculoskeletal: no clubbing / cyanosis. No joint deformity upper and lower extremities. Good ROM, no contractures. Normal muscle tone.  Skin: no rashes, lesions, ulcers. No induration Neurologic: CN 2-12 grossly intact. Sensation intact, DTR normal. Strength 5/5 in all 4.  Psychiatric: Normal judgment and insight. Alert and oriented x 3. Normal mood.   Labs on Admission: I have personally reviewed following labs and imaging studies  CBC:  Recent Labs Lab 06/30/16 0737 07/01/16 1856  WBC 2.9* 2.8*  NEUTROABS 2.4 1.7  HGB 13.8 13.7  HCT 41.2 39.2  MCV 78.8 77.2*  PLT 160 122*   Basic Metabolic Panel:  Recent Labs Lab 06/30/16 0737 07/01/16 1856  NA 129* 124*  K 3.4* 3.5  CL 99* 94*  CO2 24 25  GLUCOSE 185* 113*  BUN 12 15  CREATININE 1.01 0.96  CALCIUM 8.3* 7.9*    Liver Function Tests:  Recent Labs Lab 06/30/16 0737  AST 145*  ALT 88*  ALKPHOS 135*  BILITOT 0.7  PROT 6.6  ALBUMIN 3.4*   Urine analysis:    Component Value Date/Time   COLORURINE AMBER (A) 06/30/2016 0628   APPEARANCEUR HAZY (A) 06/30/2016 0628   LABSPEC 1.015 06/30/2016 0628   PHURINE 6.0 06/30/2016 0628   GLUCOSEU 100 (A) 06/30/2016 0628   HGBUR NEGATIVE 06/30/2016 0628   BILIRUBINUR SMALL (A) 06/30/2016 0628   KETONESUR NEGATIVE 06/30/2016 0628   PROTEINUR 30 (A) 06/30/2016 0628   NITRITE NEGATIVE 06/30/2016 0628   LEUKOCYTESUR NEGATIVE 06/30/2016 0628   Radiological Exams on Admission: Dg Chest 2 View  Result Date: 07/01/2016 CLINICAL DATA:  Dysarthria with confusion and weakness. EXAM: CHEST  2 VIEW COMPARISON:  None. FINDINGS: The heart size and mediastinal contours are within normal limits. Both lungs are clear. The visualized skeletal structures are unremarkable. IMPRESSION: Normal chest. Electronically Signed   By: Francene Boyers M.D.   On: 07/01/2016 19:30   Ct Head Wo Contrast  Result Date: 07/01/2016 CLINICAL DATA:  Confusion, slurred speech EXAM: CT HEAD  WITHOUT CONTRAST TECHNIQUE: Contiguous axial images were obtained from the base of the skull through the vertex without intravenous contrast. COMPARISON:  None. FINDINGS: Brain: No evidence of acute infarction, hemorrhage, hydrocephalus, extra-axial collection or mass lesion/mass effect. Vascular: No hyperdense vessel or unexpected calcification. Skull: No evidence of calvarial fracture. Sinuses/Orbits: The visualized paranasal sinuses are essentially clear. The mastoid air cells are unopacified. Old left medial orbital wall fracture. Other: Subcortical white matter and periventricular small vessel ischemic changes, particularly in the subcortical left frontal lobe (series 2/image 23). IMPRESSION: No evidence of acute intracranial abnormality. Mild small vessel ischemic changes. Electronically Signed   By: Charline Bills M.D.   On: 07/01/2016 19:41    EKG: Independently reviewed. Sinus Rhythm  Assessment/Plan Active Problems:   Hyponatremia   Altered mental status  Assessment:  I suspect his confusion is partly due to his hyponatremia, and partly from the new narcotics he tool.  Yesterday, his Na was 129, and today it is 124, so it is an acute drop. He has hypovolemic hyponatremia, aggravated by beer potomania as well. WBC 2.8, probably from alcohol use.  He has a mild fever, and will be pan cultured and observed. CT head unremarkable.  AMS:  Will continue with IV NS.  Follow Na every 8 hours and avoid too quick correction.  Will d/c narcotics and observe for clearing of his confusion.   Hyponatremia:  As above.  He likely has acute hypovolemic hyponatremia.  Will avoid quick correction.  Volume depletion:  Will hold diuretics, give IVF.   Follow Na carefully.    DVT prophylaxis: Heparin Code Status: Full Family Communication: Family at bedside Disposition Plan: discharge once improved Consults called: None Admission status: Admit to inpatient   Houston SirenPeter Jodey Burbano, MD FACP [facility]  If 7PM-7AM, please contact night-coverage [email or web address] Password [email or web address]  07/01/2016, 8:40 PM   By signing my name below, I, Bobbie Stackhristopher Reid, attest that this documentation has been prepared under the direction and in the presence of Houston SirenPeter Adair Lemar, MD. Electronically signed: Bobbie Stackhristopher Reid, Scribe.  07/01/16, 8:40 PM

## 2016-07-01 NOTE — ED Notes (Signed)
Dr. Kohut at bedside 

## 2016-07-02 ENCOUNTER — Inpatient Hospital Stay (HOSPITAL_COMMUNITY): Payer: Medicare Other

## 2016-07-02 DIAGNOSIS — F1010 Alcohol abuse, uncomplicated: Secondary | ICD-10-CM

## 2016-07-02 DIAGNOSIS — R7989 Other specified abnormal findings of blood chemistry: Secondary | ICD-10-CM

## 2016-07-02 DIAGNOSIS — E876 Hypokalemia: Secondary | ICD-10-CM

## 2016-07-02 DIAGNOSIS — R945 Abnormal results of liver function studies: Secondary | ICD-10-CM

## 2016-07-02 LAB — OSMOLALITY: Osmolality: 266 mOsm/kg — ABNORMAL LOW (ref 275–295)

## 2016-07-02 LAB — BASIC METABOLIC PANEL
Anion gap: 7 (ref 5–15)
Anion gap: 4 — ABNORMAL LOW (ref 5–15)
BUN: 12 mg/dL (ref 6–20)
BUN: 11 mg/dL (ref 6–20)
CALCIUM: 8 mg/dL — AB (ref 8.9–10.3)
CALCIUM: 8 mg/dL — AB (ref 8.9–10.3)
CHLORIDE: 101 mmol/L (ref 101–111)
CHLORIDE: 103 mmol/L (ref 101–111)
CO2: 24 mmol/L (ref 22–32)
CO2: 25 mmol/L (ref 22–32)
CREATININE: 0.79 mg/dL (ref 0.61–1.24)
CREATININE: 0.83 mg/dL (ref 0.61–1.24)
GFR calc non Af Amer: >60 mL/min (ref >60)
GFR calc non Af Amer: >60 mL/min (ref >60)
GLUCOSE: 97 mg/dL (ref 65–99)
GLUCOSE: 101 mg/dL — AB (ref 65–99)
Potassium: 3.4 mmol/L — ABNORMAL LOW (ref 3.5–5.1)
Potassium: 4.1 mmol/L (ref 3.5–5.1)
Sodium: 132 mmol/L — ABNORMAL LOW (ref 135–145)
Sodium: 132 mmol/L — ABNORMAL LOW (ref 135–145)

## 2016-07-02 LAB — URINALYSIS, ROUTINE W REFLEX MICROSCOPIC
BILIRUBIN URINE: NEGATIVE
Glucose, UA: NEGATIVE mg/dL
Ketones, ur: NEGATIVE mg/dL
LEUKOCYTES UA: NEGATIVE
NITRITE: NEGATIVE
PH: 5.5 (ref 5.0–8.0)
Protein, ur: NEGATIVE mg/dL

## 2016-07-02 LAB — URINE MICROSCOPIC-ADD ON

## 2016-07-02 LAB — MAGNESIUM: MAGNESIUM: 1.7 mg/dL (ref 1.7–2.4)

## 2016-07-02 LAB — URINE CULTURE

## 2016-07-02 LAB — OSMOLALITY, URINE: OSMOLALITY UR: 209 mosm/kg — AB (ref 300–900)

## 2016-07-02 LAB — ETHANOL: ALCOHOL ETHYL (B): 5 mg/dL — AB (ref <5)

## 2016-07-02 LAB — CORTISOL: Cortisol, Plasma: 16.2 ug/dL

## 2016-07-02 MED ORDER — THIAMINE HCL 100 MG/ML IJ SOLN: 100.0000 mg | Freq: Every day | INTRAMUSCULAR | Status: DC

## 2016-07-02 MED ORDER — LORAZEPAM 1 MG PO TABS: 0.0000 mg | ORAL_TABLET | Freq: Four times a day (QID) | ORAL | Status: DC

## 2016-07-02 MED ORDER — CHLORPROMAZINE HCL 25 MG PO TABS
25.0000 mg | ORAL_TABLET | Freq: Three times a day (TID) | ORAL | Status: DC | PRN
  Administered 2016-07-02: 25 mg via ORAL
  Filled 2016-07-02 (×4): qty 1

## 2016-07-02 MED ORDER — CIPROFLOXACIN IN D5W 400 MG/200ML IV SOLN
400.0000 mg | Freq: Two times a day (BID) | INTRAVENOUS | Status: DC
  Administered 2016-07-02 – 2016-07-03 (×2): 400 mg via INTRAVENOUS
  Filled 2016-07-02 (×2): qty 200

## 2016-07-02 MED ORDER — LORAZEPAM 2 MG/ML IJ SOLN: 1.0000 mg | Freq: Four times a day (QID) | INTRAMUSCULAR | Status: DC | PRN

## 2016-07-02 MED ORDER — LORAZEPAM 1 MG PO TABS: 1.0000 mg | ORAL_TABLET | Freq: Four times a day (QID) | ORAL | Status: DC | PRN

## 2016-07-02 MED ORDER — VITAMIN B-1 100 MG PO TABS
100.0000 mg | ORAL_TABLET | Freq: Every day | ORAL | Status: DC
  Administered 2016-07-02 – 2016-07-03 (×2): 100 mg via ORAL
  Filled 2016-07-02 (×2): qty 1

## 2016-07-02 MED ORDER — POTASSIUM CHLORIDE CRYS ER 20 MEQ PO TBCR
40.0000 meq | EXTENDED_RELEASE_TABLET | Freq: Once | ORAL | Status: AC
  Administered 2016-07-02: 40 meq via ORAL
  Filled 2016-07-02: qty 2

## 2016-07-02 MED ORDER — ADULT MULTIVITAMIN W/MINERALS CH
1.0000 | ORAL_TABLET | Freq: Every day | ORAL | Status: DC
  Administered 2016-07-02 – 2016-07-03 (×2): 1 via ORAL
  Filled 2016-07-02 (×2): qty 1

## 2016-07-02 MED ORDER — LORAZEPAM 1 MG PO TABS: 0.0000 mg | ORAL_TABLET | Freq: Two times a day (BID) | ORAL | Status: DC

## 2016-07-02 MED ORDER — FOLIC ACID 1 MG PO TABS
1.0000 mg | ORAL_TABLET | Freq: Every day | ORAL | Status: DC
  Administered 2016-07-02 – 2016-07-03 (×2): 1 mg via ORAL
  Filled 2016-07-02 (×2): qty 1

## 2016-07-02 MED ORDER — MAGNESIUM SULFATE 2 GM/50ML IV SOLN
2.0000 g | Freq: Once | INTRAVENOUS | Status: AC
  Administered 2016-07-02: 2 g via INTRAVENOUS
  Filled 2016-07-02: qty 50

## 2016-07-02 NOTE — Progress Notes (Signed)
Nutrition Brief Note  Patient identified on the Malnutrition Screening Tool (MST) Report  Wt Readings from Last 15 Encounters:  07/01/16 163 lb 3.2 oz (74 kg)  06/30/16 173 lb (78.5 kg)  01/05/16 182 lb (82.6 kg)  10/22/11 185 lb (83.9 kg)    Body mass index is 23.42 kg/m. Patient meets criteria for Healthy/normal weight/height based on current BMI.   Current diet order is Heart Healthy, there is no documented intake at this time  Pt states he "is ready to go home". He/ wife report that the wt loss was related dehydrated and drinking to much beer. "Im not doing that anymore", he reports.   RD asked about his apparent long term weight loss. Pt and spouse equate this due to recent strenuous activities such as yard work,  a lot of sweating and again the poor rehydration with alcohol. Pt is not concerned at all about this.   No nutrition interventions warranted at this time. If nutrition issues arise, please consult RD.   Kevin LouisNathan [name] RD, LDN, CNSC Clinical Nutrition Pager: [phone] 07/02/2016 11:32 AM

## 2016-07-02 NOTE — Care Management Note (Addendum)
Case Management Note  Patient Details  Name: Kevin Odom MRN: [id number] Date of Birth: [birth year]  Subjective/Objective:   Patient adm from, ind with ADL's. [facility] [place]. He has insurance, reports no issues. List given to patient of providers accepting patient if needed                 Action/Plan: Anticipate DC home with self care. No CM needs.    Expected Discharge Date:                  Expected Discharge Plan:  Home/Self Care  In-House Referral:  NA  Discharge planning Services  CM Consult  Post Acute Care Choice:  NA Choice offered to:  NA  DME Arranged:    DME Agency:     HH Arranged:    HH Agency:     Status of Service:  Completed, signed off  If discussed at [employer] Length of Stay Meetings, dates discussed:    Additional Comments:  Tanaysha Alkins, Chrystine OilerSharley Diane, RN 07/02/2016, 11:50 AM

## 2016-07-02 NOTE — Progress Notes (Signed)
Pharmacy Antibiotic Note  Kevin Odom is a 65 y.o. male admitted on 07/01/2016 with UTI and Intra-abdominal Infection. Pharmacy has been consulted for Cipro dosing.  Plan: Cipro 400mg  IV ever 12 hours. Monitor labs, micro and vitals.  Dose stable, sign off.  Height: 5\' 10"  (177.8 cm) Weight: 163 lb 3.2 oz (74 kg) IBW/kg (Calculated) : 73  Temp (24hrs), Avg:99.5 F (37.5 C), Min:97.9 F (36.6 C), Max:101.7 F (38.7 C)   Recent Labs Lab 06/30/16 0737 07/01/16 1856 07/01/16 2250 07/02/16 0714 07/02/16 1438  WBC 2.9* 2.8*  --   --   --   CREATININE 1.01 0.96 0.85 0.79 0.83    Estimated Creatinine Clearance: 94.1 mL/min (by C-G formula based on SCr of 0.83 mg/dL).    Allergies  Allergen Reactions  . Vancomycin Anaphylaxis   Antimicrobials this admission: Cipro 8/22 >>    Dose adjustments this admission: n/a   Microbiology results: 8/20 UCx: (-)   Thank you for allowing pharmacy to be a part of this patient's care.  Mady GemmaHayes, Shamere Dilworth R 07/02/2016 4:13 PM

## 2016-07-02 NOTE — Progress Notes (Signed)
PROGRESS NOTE    Kevin Odom  ZOX:[id number] DOB: 1951/10/31 DOA: 07/01/2016 PCP: No PCP Per Patient    Brief Narrative:  65 y.o. male beer drinker with medical history significant of HTN and hypercholesterolemia, presents with complaints of confusion that onset earlier today. Pt was cutting the grass earlier today in the severe heat,when he started to become confused along with having slurred speech. Pt reports consuming a few beers several days ago. He is also noted to be a little confused but he is aware of the confusion. He admits to having some nausea, but no vomiting. He was seen in the ER yesterday as well.   He was given hydrocodone for his back pain, and he hadn't been on this previously.  He denied focal weakness or any focal neurological symptoms.  In the ER, his speech is more fluent, and he is less confused  Assessment & Plan:   Principal Problem:   Hyponatremia Active Problems:   Altered mental status   Alcohol abuse   Elevated liver function tests   Hypomagnesemia   Hypokalemia   1. Hyponatremia 1. Sodium much improved with IV fluids 2. Continue to monitor for now 2. Acute encephalopathy 1. Patient near baseline at present 2. Suspect related to presenting hyponatremia 3. Elevated liver function test 1. Patient with elevated alkaline phosphatase of 252 with AST and ALT elevated. AST is nearly double that of ALT, suggestive of alcoholic liver disease. 2. Patient did undergo right upper quadrant ultrasound with findings of a contracted gallbladder and possible wall thickening, no sonographic Murphy sign. Recommendations are for follow-up nuc med study. 3. Will order HIDA scan 4. Possible UTI 1. Patient diagnosed with right-sided kidney stone recently 2. Patient had been taken azithromycin prior to hospital admission for presumed UTI 3. Patient noted to be febrile overnight. No leukocytosis 4. Continue patient on empiric ciprofloxacin which would offer coverage for  both UTI as well as biliary disease 5. History of alcohol abuse 1. Patient reports over 50 years of daily alcohol abuse. Last intake was supposedly on 06/28/2016. 2. No signs of withdrawals at this time 3. Will continue patient on CIWA while protocol 6. Hypokalemia 1. Resolved 7. Hypomagnesemia 1. Magnesium of 1.7. Will replace  DVT prophylaxis: Heparin subcutaneous Code Status: Full code Family Communication: Patient in room, family at bedside Disposition Plan: Uncertain at this time  Consultants:     Procedures:   Right upper quadrant ultrasound 06/30/2016  Antimicrobials: Anti-infectives    None      Subjective: Patient reports feeling much better. Questioning about going home  Objective: Vitals:   07/01/16 2130 07/01/16 2234 07/02/16 0647 07/02/16 1525  BP: 142/76 114/62 118/68 116/62  Pulse: 88 84 77 66  Resp: (!) 28 (!) 22 20 20   Temp:  (!) 101.7 F (38.7 C) 97.9 F (36.6 C) 98.1 F (36.7 C)  TempSrc:  Oral Oral   SpO2: 95% 99% 98% 100%  Weight:  74 kg (163 lb 3.2 oz)    Height:  5\' 10"  (1.778 m)      Intake/Output Summary (Last 24 hours) at 07/02/16 1557 Last data filed at 07/02/16 1300  Gross per 24 hour  Intake             1980 ml  Output                0 ml  Net             1980 ml   [employer]  07/01/16 1833 07/01/16 2234  Weight: 73.9 kg (163 lb) 74 kg (163 lb 3.2 oz)    Examination:  General exam: Appears calm and comfortable  Respiratory system: Clear to auscultation. Respiratory effort normal. Cardiovascular system: S1 & S2 heard, RRR. Gastrointestinal system: Abdomen is nondistended, soft and nontender. No organomegaly or masses felt. Normal bowel sounds heard. Central nervous system: Alert and oriented. No focal neurological deficits. Extremities: Symmetric 5 x 5 power. Skin: No rashes, lesions Psychiatry: Judgement and insight appear normal. Mood & affect appropriate.   Data Reviewed: I have personally reviewed following  labs and imaging studies  CBC:  Recent Labs Lab 06/30/16 0737 07/01/16 1856  WBC 2.9* 2.8*  NEUTROABS 2.4 1.7  HGB 13.8 13.7  HCT 41.2 39.2  MCV 78.8 77.2*  PLT 160 122*   Basic Metabolic Panel:  Recent Labs Lab 06/30/16 0737 07/01/16 1856 07/01/16 2250 07/02/16 0714 07/02/16 1438  NA 129* 124* 125* 132* 132*  K 3.4* 3.5 3.4* 3.4* 4.1  CL 99* 94* 94* 101 103  CO2 24 25 25 24 25   GLUCOSE 185* 113* 100* 97 101*  BUN 12 15 15 12 11   CREATININE 1.01 0.96 0.85 0.79 0.83  CALCIUM 8.3* 7.9* 7.6* 8.0* 8.0*  MG  --   --   --  1.7  --    GFR: Estimated Creatinine Clearance: 94.1 mL/min (by C-G formula based on SCr of 0.83 mg/dL). Liver Function Tests:  Recent Labs Lab 06/30/16 0737 07/01/16 1856  AST 145* 153*  ALT 88* 90*  ALKPHOS 135* 252*  BILITOT 0.7 2.0*  PROT 6.6 6.5  ALBUMIN 3.4* 3.4*   No results for input(s): LIPASE, AMYLASE in the last 168 hours. No results for input(s): AMMONIA in the last 168 hours. Coagulation Profile: No results for input(s): INR, PROTIME in the last 168 hours. Cardiac Enzymes: No results for input(s): CKTOTAL, CKMB, CKMBINDEX, TROPONINI in the last 168 hours. BNP (last 3 results) No results for input(s): PROBNP in the last 8760 hours. HbA1C: No results for input(s): HGBA1C in the last 72 hours. CBG: No results for input(s): GLUCAP in the last 168 hours. Lipid Profile: No results for input(s): CHOL, HDL, LDLCALC, TRIG, CHOLHDL, LDLDIRECT in the last 72 hours. Thyroid Function Tests:  Recent Labs  07/01/16 1856  TSH 0.694   Anemia Panel: No results for input(s): VITAMINB12, FOLATE, FERRITIN, TIBC, IRON, RETICCTPCT in the last 72 hours. Sepsis Labs: No results for input(s): PROCALCITON, LATICACIDVEN in the last 168 hours.  Recent Results (from the past 240 hour(s))  Urine culture     Status: Abnormal   Collection Time: 06/30/16  6:28 AM  Result Value Ref Range Status   Specimen Description URINE, CLEAN CATCH  Final    Special Requests NONE  Final   Culture (A)  Final    <10,000 COLONIES/mL INSIGNIFICANT GROWTH [facility]    Report Status 07/02/2016 FINAL  Final     Radiology Studies: Dg Chest 2 View  Result Date: 07/01/2016 CLINICAL DATA:  Dysarthria with confusion and weakness. EXAM: CHEST  2 VIEW COMPARISON:  None. FINDINGS: The heart size and mediastinal contours are within normal limits. Both lungs are clear. The visualized skeletal structures are unremarkable. IMPRESSION: Normal chest. Electronically Signed   By: Francene BoyersJames  Maxwell M.D.   On: 07/01/2016 19:30   Ct Head Wo Contrast  Result Date: 07/01/2016 CLINICAL DATA:  Confusion, slurred speech EXAM: CT HEAD WITHOUT CONTRAST TECHNIQUE: Contiguous axial images were obtained from the  base of the skull through the vertex without intravenous contrast. COMPARISON:  None. FINDINGS: Brain: No evidence of acute infarction, hemorrhage, hydrocephalus, extra-axial collection or mass lesion/mass effect. Vascular: No hyperdense vessel or unexpected calcification. Skull: No evidence of calvarial fracture. Sinuses/Orbits: The visualized paranasal sinuses are essentially clear. The mastoid air cells are unopacified. Old left medial orbital wall fracture. Other: Subcortical white matter and periventricular small vessel ischemic changes, particularly in the subcortical left frontal lobe (series 2/image 23). IMPRESSION: No evidence of acute intracranial abnormality. Mild small vessel ischemic changes. Electronically Signed   By: Charline BillsSriyesh  Krishnan M.D.   On: 07/01/2016 19:41   [place] Abdomen Limited Ruq  Result Date: 07/02/2016 CLINICAL DATA:  Elevated liver enzymes associated with nausea ; history of alcohol abuse, hyperlipidemia EXAM: US ABDOMEN LIMITED - RIGHT UPPER QUADRANT COMPARISON:  None in PACs FINDINGS: Gallbladder: The gallbladder is contracted and exhibits subjective wall thickening to 5 mm. No gallstones or sludge are observed. There is no  positive sonographic Murphy's sign. Common bile duct: Diameter: 4 mm Liver: The hepatic echotexture is normal. There is no focal mass nor ductal dilation. IMPRESSION: Contracted gallbladder with subjective wall thickening. No gallstones or sonographic evidence of acute cholecystitis. If chronic cholecystitis is suspected clinically, a nuclear medicine hepatobiliary scan may be useful. Normal appearance of the liver and common bile duct. Electronically Signed   By: David  SwazilandJordan M.D.   On: 07/02/2016 14:37    Scheduled Meds: . aspirin EC  81 mg Oral Daily  . atorvastatin  40 mg Oral q1800  . feeding supplement (ENSURE ENLIVE)  237 mL Oral BID BM  . folic acid  1 mg Oral Daily  . heparin  5,000 Units Subcutaneous Q8H  . LORazepam  0-4 mg Oral Q6H   Followed by  . [START ON 07/04/2016] LORazepam  0-4 mg Oral Q12H  . multivitamin with minerals  1 tablet Oral Daily  . sodium chloride flush  3 mL Intravenous Q12H  . thiamine  100 mg Oral Daily   Or  . thiamine  100 mg Intravenous Daily   Continuous Infusions: . sodium chloride 150 mL/hr at 07/02/16 1459     LOS: 1 day   CHIU, Scheryl MartenSTEPHEN K, MD [facility] Pager [phone]  If 7PM-7AM, please contact night-coverage [email or web address] Password [email or web address] 07/02/2016, 3:57 PM

## 2016-07-02 NOTE — Progress Notes (Signed)
Pt c/o hiccups. MD paged and no new orders given.

## 2016-07-03 ENCOUNTER — Inpatient Hospital Stay (HOSPITAL_COMMUNITY): Payer: Medicare Other

## 2016-07-03 DIAGNOSIS — R4182 Altered mental status, unspecified: Secondary | ICD-10-CM

## 2016-07-03 DIAGNOSIS — R7989 Other specified abnormal findings of blood chemistry: Secondary | ICD-10-CM

## 2016-07-03 DIAGNOSIS — F1010 Alcohol abuse, uncomplicated: Secondary | ICD-10-CM

## 2016-07-03 DIAGNOSIS — E871 Hypo-osmolality and hyponatremia: Principal | ICD-10-CM

## 2016-07-03 LAB — HIV ANTIBODY (ROUTINE TESTING W REFLEX): HIV SCREEN 4TH GENERATION: NONREACTIVE

## 2016-07-03 LAB — COMPREHENSIVE METABOLIC PANEL
ALBUMIN: 2.9 g/dL — AB (ref 3.5–5.0)
ALK PHOS: 227 U/L — AB (ref 38–126)
ALT: 151 U/L — ABNORMAL HIGH (ref 17–63)
ANION GAP: 6 (ref 5–15)
AST: 369 U/L — ABNORMAL HIGH (ref 15–41)
BUN: 11 mg/dL (ref 6–20)
CALCIUM: 7.8 mg/dL — AB (ref 8.9–10.3)
CHLORIDE: 106 mmol/L (ref 101–111)
CO2: 22 mmol/L (ref 22–32)
Creatinine, Ser: 0.71 mg/dL (ref 0.61–1.24)
GFR calc non Af Amer: >60 mL/min (ref >60)
GLUCOSE: 114 mg/dL — AB (ref 65–99)
Potassium: 3.6 mmol/L (ref 3.5–5.1)
SODIUM: 134 mmol/L — AB (ref 135–145)
Total Bilirubin: 0.9 mg/dL (ref 0.3–1.2)
Total Protein: 5.7 g/dL — ABNORMAL LOW (ref 6.5–8.1)

## 2016-07-03 LAB — CBC
HEMATOCRIT: 36.2 % — AB (ref 39.0–52.0)
HEMOGLOBIN: 12.5 g/dL — AB (ref 13.0–17.0)
MCH: 26.7 pg (ref 26.0–34.0)
MCHC: 34.5 g/dL (ref 30.0–36.0)
MCV: 77.2 fL — AB (ref 78.0–100.0)
Platelets: 137 10*3/uL — ABNORMAL LOW (ref 150–400)
RBC: 4.69 MIL/uL (ref 4.22–5.81)
RDW: 14.9 % (ref 11.5–15.5)
WBC: 4.8 10*3/uL (ref 4.0–10.5)

## 2016-07-03 LAB — URIC ACID, RANDOM URINE: Uric Acid, Urine: 18.5 mg/dL

## 2016-07-03 LAB — UREA NITROGEN, URINE: Urea Nitrogen, Ur: 323 mg/dL

## 2016-07-03 MED ORDER — TECHNETIUM TC 99M MEBROFENIN IV KIT
5.0000 | PACK | Freq: Once | INTRAVENOUS | Status: AC | PRN
  Administered 2016-07-03: 5.15 via INTRAVENOUS

## 2016-07-03 NOTE — Progress Notes (Signed)
Pt discharged self against medical advice.

## 2016-07-03 NOTE — Discharge Summary (Signed)
Physician Discharge Summary  Kevin Odom ZOX:[id number] DOB: 05/30/1951 DOA: 07/01/2016  PCP: No PCP Per Patient  Admit date: 07/01/2016 Discharge date: 07/03/2016  PATIENT LEFT THE HOSPITAL AGAINST MEDICAL ADVICE  Brief/Interim Summary: 65 y/o male with history of alcohol abuse, HTN, HLD, presented with complaints of confusion. He was noted to be mildly hyponatremic and felt to be possibly dehydrated. Also, it was noted that he was recently started on hydrocodone. On arrival to ED, he was noted to have elevated LFTs, but did not have any abdominal pain. He did have vomiting, but this resolved with supportive treatments. His confusion resolved with hydration and cessation of narcotics and mental status is now back to baseline. He is on CIWA protocol, but has had scores of 0 and has not required any ativan. He was noted to have elevated LFTs. Imaging was unrevealing and HIDA scan negative. He did not have any abdominal pain. This may have been related to recent alcohol. Since his transaminases were trending up, he was advised to stay in the hospital for further observation/evaluation. Patient adamantly declined and requested discharge. He was explained the risks of leaving the hospital against medical advice including worsening liver function/failure and possible death. He verbalized understanding. He was advised to return to the hospital if his symptoms worsened. Patient signed out against medical advice.  Discharge Diagnoses:  Principal Problem:   Hyponatremia Active Problems:   Altered mental status   Alcohol abuse   Elevated liver function tests   Hypomagnesemia   Hypokalemia     Allergies  Allergen Reactions  . Vancomycin Anaphylaxis    Consultations:     Procedures/Studies: Dg Chest 2 View  Result Date: 07/01/2016 CLINICAL DATA:  Dysarthria with confusion and weakness. EXAM: CHEST  2 VIEW COMPARISON:  None. FINDINGS: The heart size and mediastinal contours are within normal  limits. Both lungs are clear. The visualized skeletal structures are unremarkable. IMPRESSION: Normal chest. Electronically Signed   By: Francene BoyersJames  Maxwell M.D.   On: 07/01/2016 19:30   Ct Head Wo Contrast  Result Date: 07/01/2016 CLINICAL DATA:  Confusion, slurred speech EXAM: CT HEAD WITHOUT CONTRAST TECHNIQUE: Contiguous axial images were obtained from the base of the skull through the vertex without intravenous contrast. COMPARISON:  None. FINDINGS: Brain: No evidence of acute infarction, hemorrhage, hydrocephalus, extra-axial collection or mass lesion/mass effect. Vascular: No hyperdense vessel or unexpected calcification. Skull: No evidence of calvarial fracture. Sinuses/Orbits: The visualized paranasal sinuses are essentially clear. The mastoid air cells are unopacified. Old left medial orbital wall fracture. Other: Subcortical white matter and periventricular small vessel ischemic changes, particularly in the subcortical left frontal lobe (series 2/image 23). IMPRESSION: No evidence of acute intracranial abnormality. Mild small vessel ischemic changes. Electronically Signed   By: Charline BillsSriyesh  Krishnan M.D.   On: 07/01/2016 19:41   Nm Hepato W/eject Fract  Result Date: 07/03/2016 CLINICAL DATA:  Atypical chest pain for 4 days. Elevated liver function tests. EXAM: NUCLEAR MEDICINE HEPATOBILIARY IMAGING WITH GALLBLADDER EF TECHNIQUE: Sequential images of the abdomen were obtained out to 60 minutes following intravenous administration of radiopharmaceutical. After oral ingestion of Ensure, gallbladder ejection fraction was determined. At 60 min, normal ejection fraction is greater than 33%. RADIOPHARMACEUTICALS:  5.2 mCi Tc-2552m  Choletec IV COMPARISON:  None. FINDINGS: Prompt uptake and biliary excretion of activity by the liver is seen. Gallbladder activity is visualized, consistent with patency of cystic duct. Biliary activity passes into small bowel, consistent with patent common bile duct. Calculated  gallbladder ejection fraction is  66%. (Normal gallbladder ejection fraction with Ensure is greater than 33%.) IMPRESSION: Normal hepatobiliary imaging. Both cystic and common bile ducts are patent. Normal gallbladder ejection fraction of 66%. Electronically Signed   By: Myles Rosenthal M.D.   On: 07/03/2016 09:54   US Abdomen Limited Ruq  Result Date: 07/02/2016 CLINICAL DATA:  Elevated liver enzymes associated with nausea ; history of alcohol abuse, hyperlipidemia EXAM: US ABDOMEN LIMITED - RIGHT UPPER QUADRANT COMPARISON:  None in PACs FINDINGS: Gallbladder: The gallbladder is contracted and exhibits subjective wall thickening to 5 mm. No gallstones or sludge are observed. There is no positive sonographic Murphy's sign. Common bile duct: Diameter: 4 mm Liver: The hepatic echotexture is normal. There is no focal mass nor ductal dilation. IMPRESSION: Contracted gallbladder with subjective wall thickening. No gallstones or sonographic evidence of acute cholecystitis. If chronic cholecystitis is suspected clinically, a nuclear medicine hepatobiliary scan may be useful. Normal appearance of the liver and common bile duct. Electronically Signed   By: David  [place] M.D.   On: 07/02/2016 14:37       Subjective: Feeling better. Wants to go home.  Discharge Exam: Vitals:   07/02/16 2105 07/03/16 0500  BP: 116/70 130/73  Pulse: 67 72  Resp: 20 20  Temp: 99.2 F (37.3 C) 99.1 F (37.3 C)   Vitals:   07/02/16 0647 07/02/16 1525 07/02/16 2105 07/03/16 0500  BP: 118/68 116/62 116/70 130/73  Pulse: 77 66 67 72  Resp: 20 20 20 20   Temp: 97.9 F (36.6 C) 98.1 F (36.7 C) 99.2 F (37.3 C) 99.1 F (37.3 C)  TempSrc: Oral  Oral Oral  SpO2: 98% 100% 97% 99%  Weight:      Height:        General: Pt is alert, awake, not in acute distress Cardiovascular: RRR, S1/S2 +, no rubs, no gallops Respiratory: CTA bilaterally, no wheezing, no rhonchi Abdominal: Soft, NT, ND, bowel sounds + Extremities: no  edema, no cyanosis    The results of significant diagnostics from this hospitalization (including imaging, microbiology, ancillary and laboratory) are listed below for reference.     Microbiology: Recent Results (from the past 240 hour(s))  Urine culture     Status: Abnormal   Collection Time: 06/30/16  6:28 AM  Result Value Ref Range Status   Specimen Description URINE, CLEAN CATCH  Final   Special Requests NONE  Final   Culture (A)  Final    <10,000 COLONIES/mL INSIGNIFICANT GROWTH Performed at [facility]    Report Status 07/02/2016 FINAL  Final     Labs: BNP (last 3 results) No results for input(s): BNP in the last 8760 hours. Basic Metabolic Panel:  Recent Labs Lab 07/01/16 1856 07/01/16 2250 07/02/16 0714 07/02/16 1438 07/03/16 0505  NA 124* 125* 132* 132* 134*  K 3.5 3.4* 3.4* 4.1 3.6  CL 94* 94* 101 103 106  CO2 25 25 24 25 22   GLUCOSE 113* 100* 97 101* 114*  BUN 15 15 12 11 11   CREATININE 0.96 0.85 0.79 0.83 0.71  CALCIUM 7.9* 7.6* 8.0* 8.0* 7.8*  MG  --   --  1.7  --   --    Liver Function Tests:  Recent Labs Lab 06/30/16 0737 07/01/16 1856 07/03/16 0505  AST 145* 153* 369*  ALT 88* 90* 151*  ALKPHOS 135* 252* 227*  BILITOT 0.7 2.0* 0.9  PROT 6.6 6.5 5.7*  ALBUMIN 3.4* 3.4* 2.9*   No results for input(s): LIPASE, AMYLASE in the  last 168 hours. No results for input(s): AMMONIA in the last 168 hours. CBC:  Recent Labs Lab 06/30/16 0737 07/01/16 1856 07/03/16 0505  WBC 2.9* 2.8* 4.8  NEUTROABS 2.4 1.7  --   HGB 13.8 13.7 12.5*  HCT 41.2 39.2 36.2*  MCV 78.8 77.2* 77.2*  PLT 160 122* 137*   Cardiac Enzymes: No results for input(s): CKTOTAL, CKMB, CKMBINDEX, TROPONINI in the last 168 hours. BNP: Invalid input(s): POCBNP CBG: No results for input(s): GLUCAP in the last 168 hours. D-Dimer No results for input(s): DDIMER in the last 72 hours. Hgb A1c No results for input(s): HGBA1C in the last 72 hours. Lipid Profile No  results for input(s): CHOL, HDL, LDLCALC, TRIG, CHOLHDL, LDLDIRECT in the last 72 hours. Thyroid function studies  Recent Labs  07/01/16 1856  TSH 0.694   Anemia work up No results for input(s): VITAMINB12, FOLATE, FERRITIN, TIBC, IRON, RETICCTPCT in the last 72 hours. Urinalysis    Component Value Date/Time   COLORURINE YELLOW 07/01/2016 2308   APPEARANCEUR CLEAR 07/01/2016 2308   LABSPEC <1.005 (L) 07/01/2016 2308   PHURINE 5.5 07/01/2016 2308   GLUCOSEU NEGATIVE 07/01/2016 2308   HGBUR TRACE (A) 07/01/2016 2308   BILIRUBINUR NEGATIVE 07/01/2016 2308   KETONESUR NEGATIVE 07/01/2016 2308   PROTEINUR NEGATIVE 07/01/2016 2308   NITRITE NEGATIVE 07/01/2016 2308   LEUKOCYTESUR NEGATIVE 07/01/2016 2308   Sepsis Labs Invalid input(s): PROCALCITONIN,  WBC,  LACTICIDVEN Microbiology Recent Results (from the past 240 hour(s))  Urine culture     Status: Abnormal   Collection Time: 06/30/16  6:28 AM  Result Value Ref Range Status   Specimen Description URINE, CLEAN CATCH  Final   Special Requests NONE  Final   Culture (A)  Final    <10,000 COLONIES/mL INSIGNIFICANT GROWTH Performed at [facility]    Report Status 07/02/2016 FINAL  Final     Time coordinating discharge: 15mins  SIGNED:   Erick BlinksMEMON,Oretta Berkland, MD  [facility] 07/03/2016, 6:39 PM Pager [phone]  If 7PM-7AM, please contact night-coverage [email or web address] Password [email or web address]

## 2016-07-03 NOTE — Care Management Important Message (Signed)
Important Message  Patient Details  Name: Kevin Odom MRN: [id number] Date of Birth: [birth year]   Medicare Important Message Given:  Yes    Seger Jani, Chrystine OilerSharley Diane, RN 07/03/2016, 9:24 AM

## 2016-07-26 LAB — TSH: TSH: 0.32 u[IU]/mL — AB (ref <=5.90)

## 2016-09-03 ENCOUNTER — Ambulatory Visit (INDEPENDENT_AMBULATORY_CARE_PROVIDER_SITE_OTHER): Payer: Medicare Other | Admitting: "Endocrinology

## 2016-09-03 ENCOUNTER — Encounter: Payer: Self-pay | Admitting: "Endocrinology

## 2016-09-03 VITALS — BP 136/74 | HR 62 | Ht 70.0 in | Wt 167.0 lb

## 2016-09-03 DIAGNOSIS — F1010 Alcohol abuse, uncomplicated: Secondary | ICD-10-CM

## 2016-09-03 DIAGNOSIS — R946 Abnormal results of thyroid function studies: Secondary | ICD-10-CM

## 2016-09-03 DIAGNOSIS — R7989 Other specified abnormal findings of blood chemistry: Secondary | ICD-10-CM

## 2016-09-03 NOTE — Progress Notes (Signed)
Subjective:    Patient ID: Kevin Odom, male    DOB: May 08, 1951, PCP Dorice Lamas, MD   Past Medical History:  Diagnosis Date  . Migraine    Past Surgical History:  Procedure Laterality Date  . FINGER SURGERY Right    Social History   Social History  . Marital status: Married    Spouse name: N/A  . Number of children: N/A  . Years of education: N/A   Social History Main Topics  . Smoking status: Current Every Day Smoker    Types: Cigars, Cigarettes  . Smokeless tobacco: Never Used  . Alcohol use No     Comment: "I drank one or two beers a day but quit 3 days ago."  . Drug use: No  . Sexual activity: Not Asked   Other Topics Concern  . None   Social History Narrative  . None   Outpatient Encounter Prescriptions as of 09/03/2016  Medication Sig  . butalbital-acetaminophen-caffeine (FIORICET WITH CODEINE) 50-325-40-30 MG capsule Take 1 capsule by mouth every 4 (four) hours as needed for headache.  . Cholecalciferol (VITAMIN D) 2000 units CAPS Take 1 capsule by mouth daily.  . Magnesium 500 MG CAPS Take by mouth 2 (two) times daily.  Marland Kitchen oxyCODONE-acetaminophen (PERCOCET) 7.5-325 MG tablet Take 1 tablet by mouth every 4 (four) hours as needed for severe pain.  . predniSONE (DELTASONE) 10 MG tablet Take 10 mg by mouth daily with breakfast.  . verapamil (VERELAN PM) 240 MG 24 hr capsule Take 240 mg by mouth at bedtime.  . Vitamin D, Ergocalciferol, (DRISDOL) 50000 units CAPS capsule Take 50,000 Units by mouth every Monday.   . [DISCONTINUED] aspirin EC 81 MG tablet Take 81 mg by mouth daily.  . [DISCONTINUED] atorvastatin (LIPITOR) 40 MG tablet Take 40 mg by mouth daily.  . [DISCONTINUED] azithromycin (ZITHROMAX) 250 MG tablet Take 250-500 mg by mouth daily. 2 tablets on day 1 starting on 06/28/16, then take one tablet on days 2 through 5  . [DISCONTINUED] cyclobenzaprine (FLEXERIL) 10 MG tablet Take 10 mg by mouth 3 (three) times daily as needed for muscle  spasms.  . [DISCONTINUED] HYDROcodone-acetaminophen (NORCO/VICODIN) 5-325 MG tablet Take 1 tablet by mouth every 6 (six) hours as needed for moderate pain.  . [DISCONTINUED] lisinopril-hydrochlorothiazide (PRINZIDE,ZESTORETIC) 20-12.5 MG tablet Take 1 tablet by mouth daily.   No facility-administered encounter medications on file as of 09/03/2016.    ALLERGIES: Allergies  Allergen Reactions  . Vancomycin Anaphylaxis   VACCINATION STATUS:  There is no immunization history on file for this patient.  HPI  65 year old male with medical history as above. He is being seen in consultation for abnormal thyroid function test including suppressed TSH of 0.32 from 07/26/2016 requested by  [facility], Birdie Hopes, MD.  - Patient with chronic alcohol abuse with abnormal transaminases, hypoalbuminemia, thrombocytopenia. He was complaining of fatigue when he was found to have suppressed TSH. He denies any prior history of thyroid dysfunction. - He is not on any antithyroid treatment at this time. He reports progressive weight loss of 15 pounds over 6 month period of time. He denies palpitations heat/cold intolerance. He noticed some tremors. Denies any history of goiter nor family history of thyroid cancer. - He complains of low libido.  Review of Systems Constitutional: + weight loss, + fatigue, no subjective hyperthermia/hypothermia Eyes: no blurry vision, no xerophthalmia ENT: no sore throat, no nodules palpated in throat, no dysphagia/odynophagia, no hoarseness Cardiovascular: no CP/SOB/palpitations/leg swelling Respiratory: no cough/SOB  Gastrointestinal: no N/V/D/C Musculoskeletal: no muscle/joint aches Skin: no rashes Neurological: + tremors,  + dizziness Psychiatric: no depression/anxiety  Objective:    BP 136/74   Pulse 62   Ht 5\' 10"  (1.778 m)   Wt 167 lb (75.8 kg)   BMI 23.96 kg/m   Wt Readings from Last 3 Encounters:  09/03/16 167 lb (75.8 kg)  07/01/16 163 lb 3.2 oz (74 kg)   06/30/16 173 lb (78.5 kg)    Physical Exam Constitutional:  in NAD Eyes: PERRLA, EOMI, no exophthalmos ENT: moist mucous membranes, no thyromegaly, no cervical lymphadenopathy Cardiovascular: RRR, No MRG Respiratory: CTA B Gastrointestinal: abdomen soft, NT, ND, BS+ Musculoskeletal: no deformities, strength intact in all 4 Skin: moist, warm, no rashes Neurological: + mild tremor with outstretched hands, DTR normal in all 4   CMP     Component Value Date/Time   NA 134 (L) 07/03/2016 0505   K 3.6 07/03/2016 0505   CL 106 07/03/2016 0505   CO2 22 07/03/2016 0505   GLUCOSE 114 (H) 07/03/2016 0505   BUN 11 07/03/2016 0505   CREATININE 0.71 07/03/2016 0505   CALCIUM 7.8 (L) 07/03/2016 0505   PROT 5.7 (L) 07/03/2016 0505   ALBUMIN 2.9 (L) 07/03/2016 0505   AST 369 (H) 07/03/2016 0505   ALT 151 (H) 07/03/2016 0505   ALKPHOS 227 (H) 07/03/2016 0505   BILITOT 0.9 07/03/2016 0505   GFRNONAA >60 07/03/2016 0505   GFRAA >60 07/03/2016 0505   From 07/26/2016 his TSH was 0.32 (normal 0.34-4.8)   Assessment & Plan:   1. Abnormal thyroid blood test - I have reviewed his available thyroid workup records and clinically evaluated patient.  He appears clinically euthyroid. His recent thyroid function test is not complete. I will send him to lab for a new set of more complete thyroid function test including TSH, free T4, free T3. He will return in 1 week for reevaluation and treatment decision if necessary. - Regarding his low libido, although it is possible that he may have hypogonadism, he is not a good candidate for testosterone treatment given his significantly elevated transaminases related to his alcohol abuse. He is counseled regarding tapering off of alcohol use to help lower his transaminases. - The concern is that he may have sustained significant hepatic damage evident from his hypoalbuminemia, thrombocytopenia and significant transaminases. He may benefit from a GI consultation  evaluation.  - I advised patient [facility] Birdie HopesSUSAN S, MD for primary care needs and possible referral to GI. Follow up plan: Return in about 1 week (around 09/10/2016) for labs today.  Marquis LunchGebre Nida, MD Phone: [phone]  Fax: [phone]   09/03/2016, 11:17 AM

## 2016-09-16 ENCOUNTER — Ambulatory Visit: Payer: Medicare Other | Admitting: "Endocrinology

## 2018-02-17 IMAGING — CT CT HEAD W/O CM
3 of 4 series · 15 of 47 positions shown, 18 images · non-contrast
Comparison: None.

CLINICAL DATA: Confusion, slurred speech

EXAM:
CT HEAD WITHOUT CONTRAST
TECHNIQUE: Contiguous axial images were obtained from the base of the skull
through the vertex without intravenous contrast.

[Series 2: head w/o · axial · non-contrast · 0.44mm/px · z∈[+60,+204]mm · 9 of 42 slices shown, 12 images]
[im 3/42  brain]
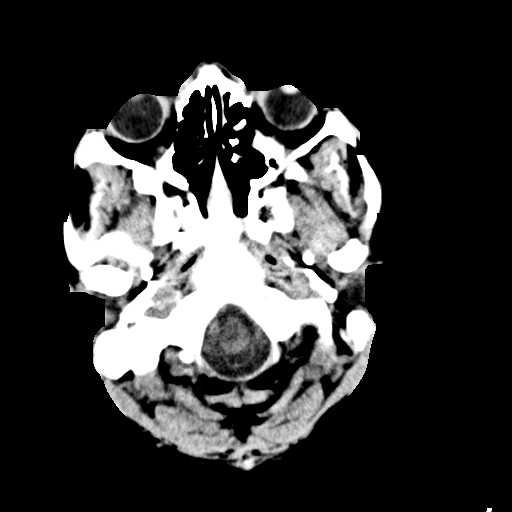
[im 3/42  bone]
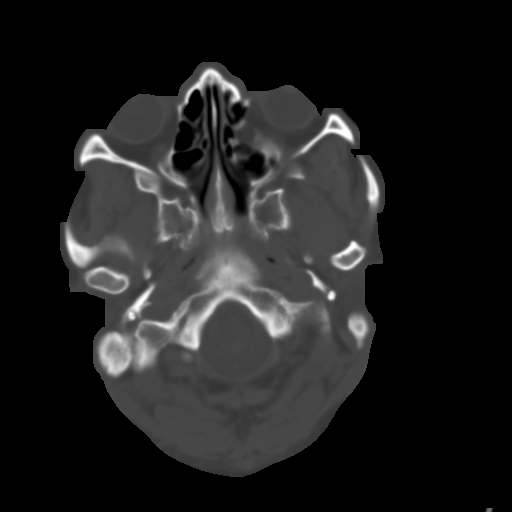
[im 9/42  brain]
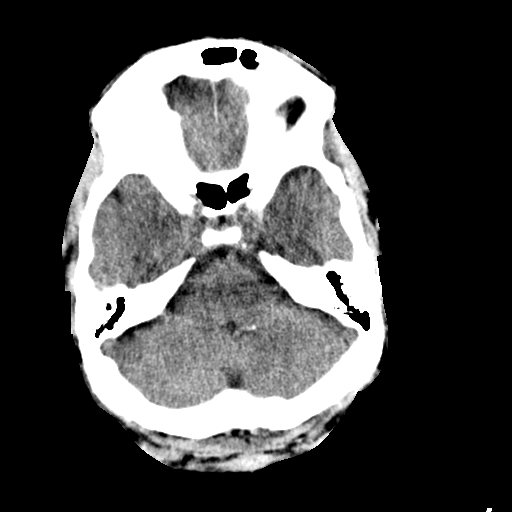
[im 12/42  brain]
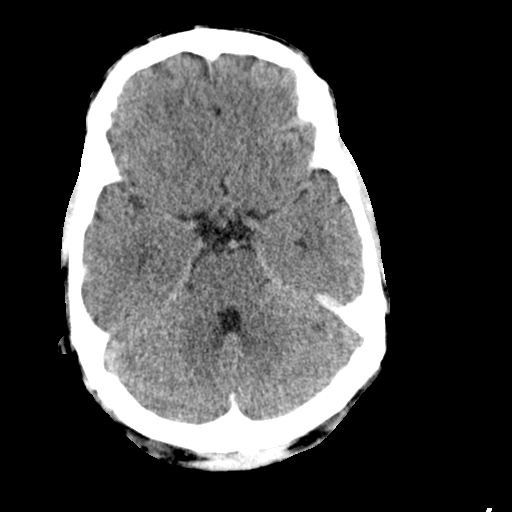
[im 18/42  brain]
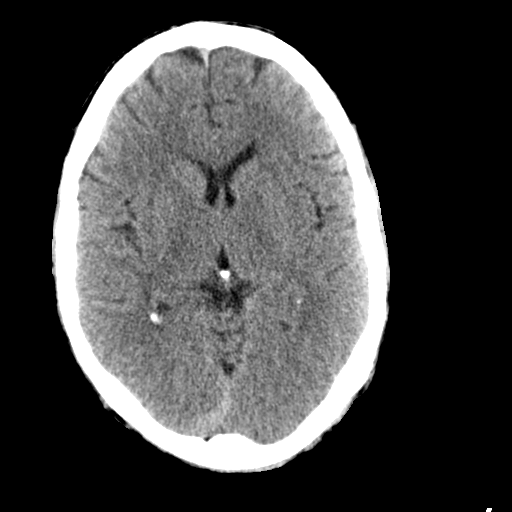
[im 21/42  brain]
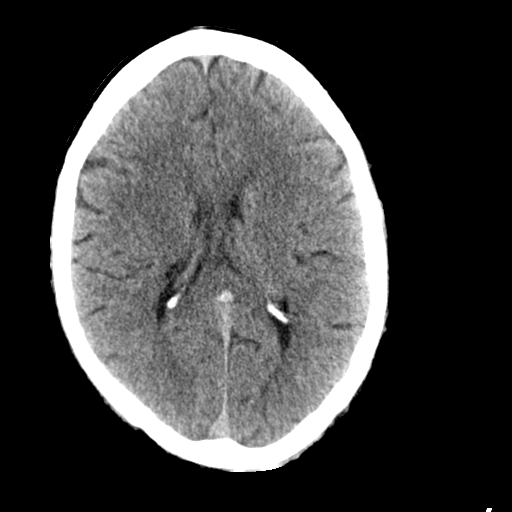
[im 21/42  bone]
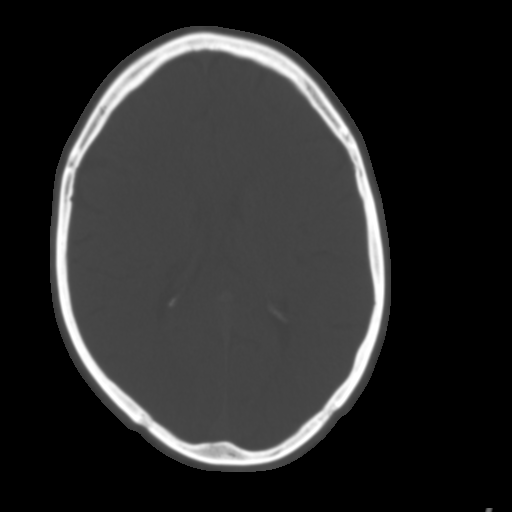
[im 24/42  brain]
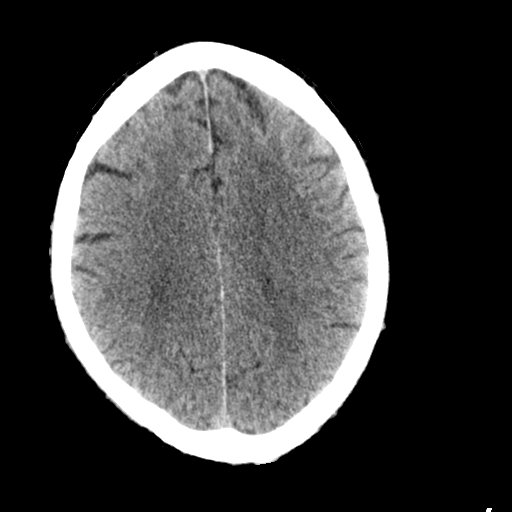
[im 30/42  brain]
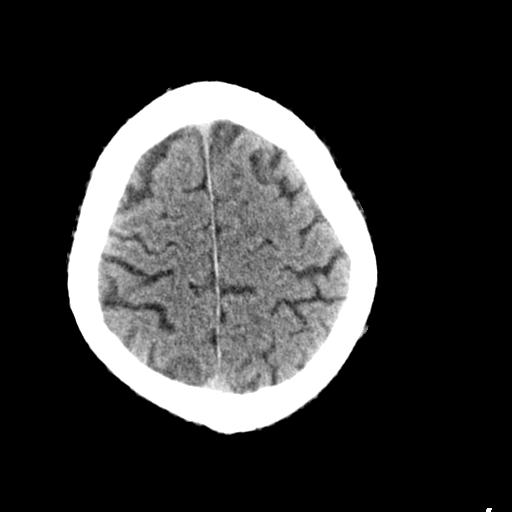
[im 33/42  brain]
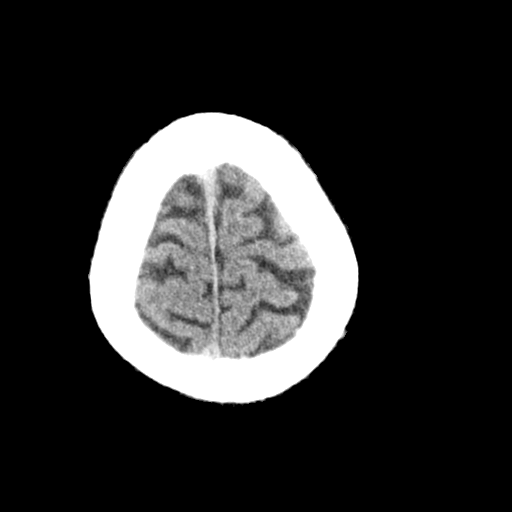
[im 39/42  brain]
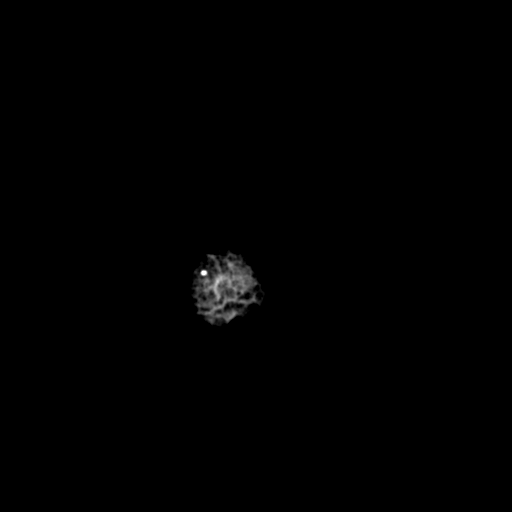
[im 39/42  bone]
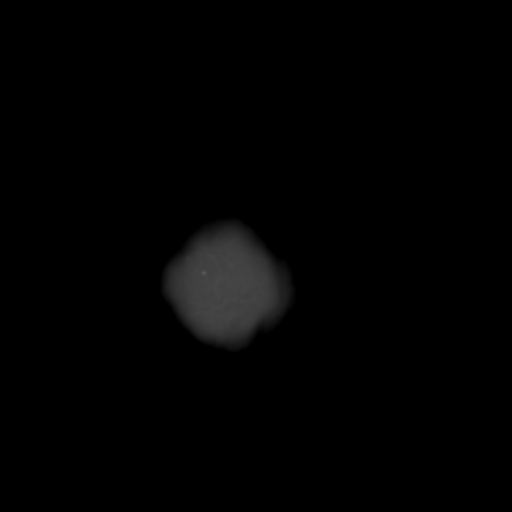

[Series 4: coronal · coronal · 0.31mm/px · 3 of 70 slices shown]
[im 24/70  brain]
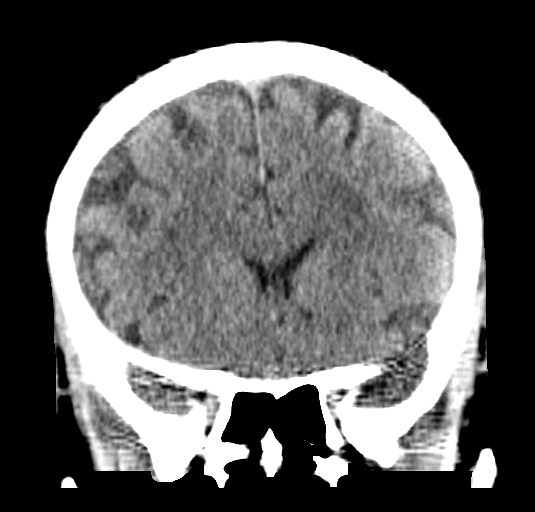
[im 31/70  brain]
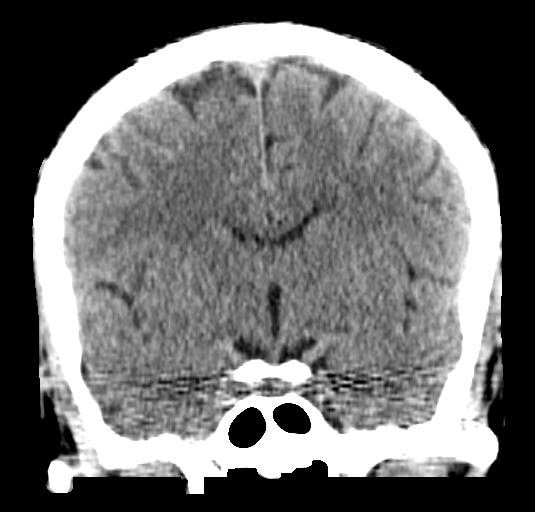
[im 39/70  brain]
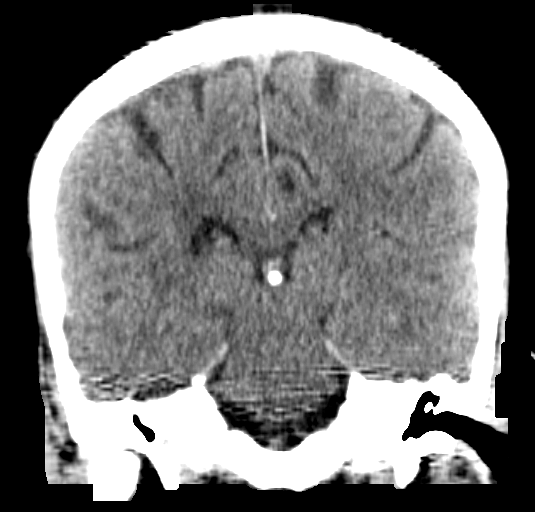

[Series 5: sagittal · sagittal · 0.31mm/px · 3 of 53 slices shown]
[im 18/53  brain]
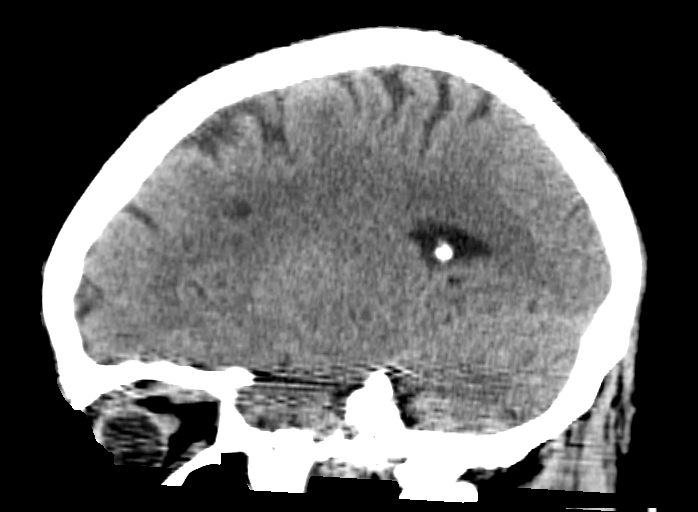
[im 27/53  brain]
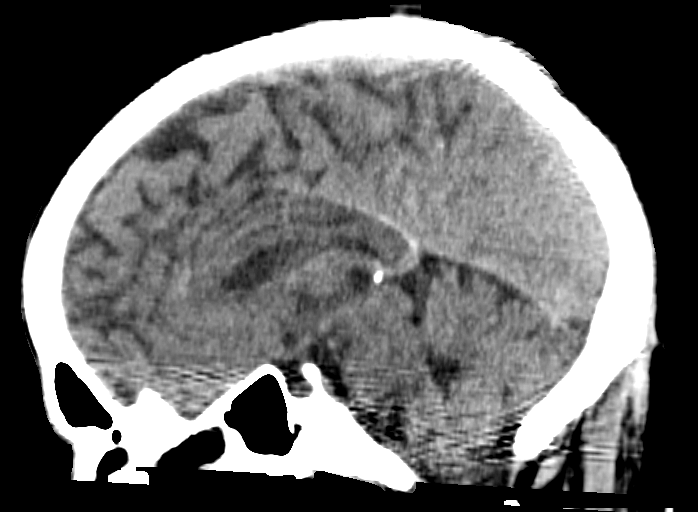
[im 35/53  brain]
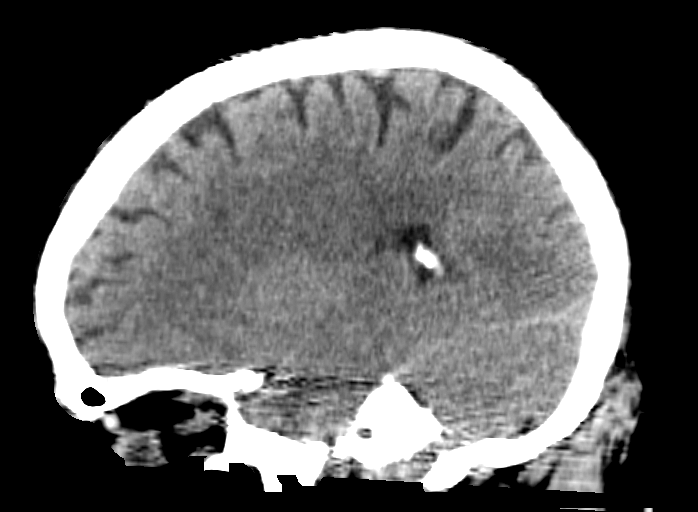

[15 of 47 positions shown; findings below may reference images not displayed]

FINDINGS: Brain: No evidence of acute infarction, hemorrhage, hydrocephalus,
extra-axial collection or mass lesion/mass effect.

Vascular: No hyperdense vessel or unexpected calcification.

Skull: No evidence of calvarial fracture.

Sinuses/Orbits: The visualized paranasal sinuses are essentially
clear. The mastoid air cells are unopacified.

Old left medial orbital wall fracture.

Other: Subcortical white matter and periventricular small vessel
ischemic changes, particularly in the subcortical left frontal lobe
(series 2/image 23).
IMPRESSION: No evidence of acute intracranial abnormality.

Mild small vessel ischemic changes.

## 2018-02-19 IMAGING — NM NM HEPATO W/GB/PHARM/[PERSON_NAME]
2 series · 12 of 12 positions shown · non-contrast
Comparison: None.

CLINICAL DATA: Atypical chest pain for 4 days. Elevated liver
function tests.

EXAM:
NUCLEAR MEDICINE HEPATOBILIARY IMAGING WITH GALLBLADDER EF
TECHNIQUE: Sequential images of the abdomen were obtained [DATE] minutes
following intravenous administration of radiopharmaceutical. After
oral ingestion of Ensure, gallbladder ejection fraction was
determined. At 60 min, normal ejection fraction is greater than 33%.
RADIOPHARMACEUTICALS:  5.2 mCi 0c-GGm  Choletec IV

[Series 1: biliary · 3.25mm/px · 6 of 60 frames shown]
[frame 6/60]
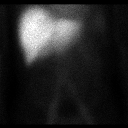
[frame 16/60]
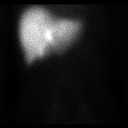
[frame 26/60]
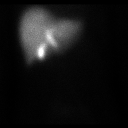
[frame 36/60]
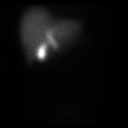
[frame 46/60]
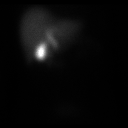
[frame 56/60]
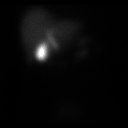

[Series 2: gbef · 3.25mm/px · 6 of 60 frames shown]
[frame 6/60]
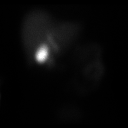
[frame 16/60]
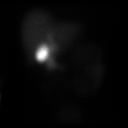
[frame 26/60]
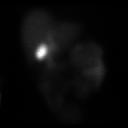
[frame 36/60]
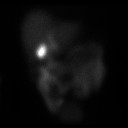
[frame 46/60]
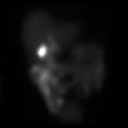
[frame 56/60]
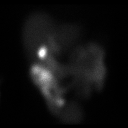

[12 of 12 positions shown; findings below may reference images not displayed]

FINDINGS: Prompt uptake and biliary excretion of activity by the liver is
seen. Gallbladder activity is visualized, consistent with patency of
cystic duct. Biliary activity passes into small bowel, consistent
with patent common bile duct.

Calculated gallbladder ejection fraction is 66%. (Normal gallbladder
ejection fraction with Ensure is greater than 33%.)
IMPRESSION: Normal hepatobiliary imaging. Both cystic and common bile ducts are
patent.

Normal gallbladder ejection fraction of 66%.

## 2021-09-27 ENCOUNTER — Emergency Department (HOSPITAL_COMMUNITY)
Admission: EM | Admit: 2021-09-27 | Discharge: 2021-09-28 | Disposition: A | Discharge status: Home / Self Care | Payer: No Typology Code available for payment source | Attending: Emergency Medicine | Admitting: Emergency Medicine

## 2021-09-27 ENCOUNTER — Encounter (HOSPITAL_COMMUNITY): Payer: Self-pay

## 2021-09-27 ENCOUNTER — Other Ambulatory Visit: Payer: Self-pay

## 2021-09-27 DIAGNOSIS — Y92410 Unspecified street and highway as the place of occurrence of the external cause: Secondary | ICD-10-CM | POA: Insufficient documentation

## 2021-09-27 DIAGNOSIS — F17210 Nicotine dependence, cigarettes, uncomplicated: Secondary | ICD-10-CM | POA: Diagnosis not present

## 2021-09-27 DIAGNOSIS — M549 Dorsalgia, unspecified: Secondary | ICD-10-CM | POA: Insufficient documentation

## 2021-09-27 DIAGNOSIS — M542 Cervicalgia: Secondary | ICD-10-CM | POA: Diagnosis not present
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
## 2023-04-21 ENCOUNTER — Emergency Department (HOSPITAL_COMMUNITY): Payer: Medicare HMO

## 2023-04-21 ENCOUNTER — Other Ambulatory Visit: Payer: Self-pay

## 2023-04-21 ENCOUNTER — Emergency Department (HOSPITAL_COMMUNITY)
Admission: EM | Admit: 2023-04-21 | Discharge: 2023-04-21 | Disposition: A | Discharge status: Home / Self Care | Payer: Medicare HMO | Attending: Emergency Medicine | Admitting: Emergency Medicine

## 2023-04-21 DIAGNOSIS — Y92410 Unspecified street and highway as the place of occurrence of the external cause: Secondary | ICD-10-CM | POA: Diagnosis not present

## 2023-04-21 DIAGNOSIS — M545 Low back pain, unspecified: Secondary | ICD-10-CM | POA: Diagnosis present

## 2023-04-21 DIAGNOSIS — M5441 Lumbago with sciatica, right side: Secondary | ICD-10-CM | POA: Diagnosis not present

## 2023-04-21 MED ORDER — HYDROCODONE-ACETAMINOPHEN 5-325 MG PO TABS: ORAL_TABLET | ORAL | 0 refills | Status: DC

## 2023-04-21 MED ORDER — OXYCODONE-ACETAMINOPHEN 5-325 MG PO TABS
1.0000 | ORAL_TABLET | Freq: Once | ORAL | Status: AC
  Administered 2023-04-21: 1 via ORAL
  Filled 2023-04-21: qty 1

## 2023-04-21 MED ORDER — METHOCARBAMOL 500 MG PO TABS: 500.0000 mg | ORAL_TABLET | Freq: Three times a day (TID) | ORAL | 0 refills | Status: AC

## 2023-04-21 NOTE — Discharge Instructions (Signed)
Alternate ice and heat to your lower back.  Follow-up with your primary care provider for recheck.
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
## 2023-04-21 NOTE — ED Notes (Signed)
ED Provider at bedside. 
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
## 2024-10-10 ENCOUNTER — Encounter (HOSPITAL_COMMUNITY): Payer: Self-pay

## 2024-10-10 ENCOUNTER — Other Ambulatory Visit: Payer: Self-pay

## 2024-10-10 ENCOUNTER — Emergency Department (HOSPITAL_COMMUNITY)
Admission: EM | Admit: 2024-10-10 | Discharge: 2024-10-10 | Disposition: A | Discharge status: Home / Self Care | Attending: Emergency Medicine | Admitting: Emergency Medicine

## 2024-10-10 DIAGNOSIS — M5441 Lumbago with sciatica, right side: Secondary | ICD-10-CM | POA: Insufficient documentation

## 2024-10-10 DIAGNOSIS — I10 Essential (primary) hypertension: Secondary | ICD-10-CM | POA: Diagnosis not present

## 2024-10-10 DIAGNOSIS — M545 Low back pain, unspecified: Secondary | ICD-10-CM | POA: Diagnosis present

## 2024-10-10 HISTORY — DX: Essential (primary) hypertension: I10

## 2024-10-10 MED ORDER — LIDOCAINE 5 % EX PTCH
1.0000 | MEDICATED_PATCH | CUTANEOUS | Status: DC
  Administered 2024-10-10: 1 via TRANSDERMAL
  Filled 2024-10-10: qty 1

## 2024-10-10 MED ORDER — HYDROCODONE-ACETAMINOPHEN 5-325 MG PO TABS: ORAL_TABLET | ORAL | 0 refills | Status: DC

## 2024-10-10 MED ORDER — METHYLPREDNISOLONE 4 MG PO TBPK: ORAL_TABLET | ORAL | 0 refills | Status: AC

## 2024-10-10 NOTE — ED Triage Notes (Signed)
 Patient presents POV c/c R leg pain x30 days. States he thinks he pulled a muscle lifting something heavy. Previously seen by the [facility] clinic and prescribed ibuprofen without relief.

## 2024-10-10 NOTE — ED Provider Notes (Signed)
 [place] EMERGENCY DEPARTMENT AT [facility] Provider Note   CSN: [id number] Arrival date & time: 10/10/24  1034     Patient presents with: Leg Pain   Kevin Odom is a 73 y.o. male.    Leg Pain Associated symptoms: back pain        Kevin Odom is a 73 y.o. male past medical history of hypertension and migraine headaches, who presents to the Emergency Department complaining of right sided low back and right upper leg pain for 1 month.  Pain began after heavy lifting.  He denies any fall.  He describes a aching pain from his right buttock that radiates into his right groin and thigh area.  Pain is worse with movement and with weightbearing.  Pain improves at rest.  He was initially seen at urgent care and hospital in [place] .  He was prescribed methocarbamol  and ibuprofen which she has been taking without relief.  He denies any numbness or weakness of his lower extremities, no saddle anesthesias, abdominal pain, fever chills, urine or bowel changes.  Prior to Admission medications   Medication Sig Start Date End Date Taking? Authorizing Provider  Cholecalciferol (VITAMIN D) 2000 units CAPS Take 1 capsule by mouth daily.    [provider]  HYDROcodone -acetaminophen  (NORCO/VICODIN) 5-325 MG tablet Take one tab po q 4 hrs prn pain 04/21/23   Twania Bujak, PA-C  Magnesium  500 MG CAPS Take by mouth 2 (two) times daily.    [provider]  methocarbamol  (ROBAXIN ) 500 MG tablet Take 1 tablet (500 mg total) by mouth 3 (three) times daily. 04/21/23   Marybella Ethier, PA-C  predniSONE (DELTASONE) 10 MG tablet Take 10 mg by mouth daily with breakfast.    [provider]  verapamil (VERELAN PM) 240 MG 24 hr capsule Take 240 mg by mouth at bedtime.    [provider]  Vitamin D, Ergocalciferol, (DRISDOL) 50000 units CAPS capsule Take 50,000 Units by mouth every Monday.     [provider]    Allergies: Vancomycin    Review  of Systems  Musculoskeletal:  Positive for back pain.  All other systems reviewed and are negative.   Updated Vital Signs BP (!) 159/89 (BP Location: Right Arm)   Pulse 66   Temp (!) 97.5 F (36.4 C)   Resp 19   Ht 5' 10 (1.778 m)   Wt 77.1 kg   SpO2 94%   BMI 24.39 kg/m   Physical Exam Vitals and nursing note reviewed.  Constitutional:      General: He is not in acute distress.    Appearance: Normal appearance. He is not toxic-appearing.  Cardiovascular:     Rate and Rhythm: Normal rate and regular rhythm.     Pulses: Normal pulses.  Pulmonary:     Effort: Pulmonary effort is normal.     Breath sounds: Normal breath sounds.  Chest:     Chest wall: No tenderness.  Abdominal:     Palpations: Abdomen is soft.     Tenderness: There is no abdominal tenderness. There is no right CVA tenderness or left CVA tenderness.  Musculoskeletal:        General: Tenderness present.     Lumbar back: Tenderness present. Normal range of motion. Negative right straight leg raise test and negative left straight leg raise test.       Back:     Comments: Localized tenderness to palpation over right lumbar paraspinal muscles and right SI joint space.  Patient has negative bilateral straight leg raises.  Skin:    General: Skin is warm.     Capillary Refill: Capillary refill takes less than 2 seconds.     Findings: No rash.  Neurological:     General: No focal deficit present.     Mental Status: He is alert.     Sensory: No sensory deficit.     Motor: No weakness.     (all labs ordered are listed, but only abnormal results are displayed) Labs Reviewed - No data to display  EKG: None  Radiology: No results found.   Procedures   Medications Ordered in the ED  lidocaine (LIDODERM) 5 % 1 patch (has no administration in time range)                                    Medical Decision Making   Patient here with right-sided low back pain for 1 month.  Pain began after heavy  lifting.  Denies fall.  Initially treated at another facility with methocarbamol  and ibuprofen without improvement.  No saddle anesthesias numbness or weakness of his extremities.  Pain does radiate into his right groin and thigh.  Denies any swelling, abdominal pain, fever or chills urine or bowel changes.  Review of medical records, patient was seen here over a year ago with low back pain had x-ray at that time that showed degenerative changes of his lower back.  On my exam today, he has some localized tenderness to his right low back, no saddle anesthesias, he is ambulatory with a cane.  No ataxia.  I suspect this is acute on chronic low back pain.  No red flags on my exam to suggest cauda equina.  He denies fall to indicate need for repeat imaging at this time.  No abdominal pain or dysuria symptoms to suggest kidney stone or pyelonephritis.   Amount and/or Complexity of Data Reviewed Radiology:     Details: On review, patient here in June 2024 had a lumbar spine film that showed degenerative changes Discussion of management or test interpretation with external provider(s):   I suspect acute on chronic pain.  No red flags on his exam today suggestive of a cauda equina.  He is ambulatory with a cane and has a steady gait.  Doubt emergent process.  Pain exacerbated by recent heavy lifting.  No fall to indicate need for repeat imaging today.  I have reviewed database, no recent narcotics. He does not have a history of diabetes, I feel that he would benefit from short course of steroids, will address his pain with short course of pain medication.  I have expressed importance of close outpatient follow-up with his PCP and he agrees to arrange follow-up.  ER return precautions were also given  Risk Prescription drug management.        Final diagnoses:  Acute right-sided low back pain with right-sided sciatica    ED Discharge Orders     None          Herlinda Milling, PA-C 10/10/24  1321    Suzette Pac, MD 10/12/24 1027

## 2024-10-10 NOTE — ED Notes (Signed)
 Pt/family received d/c paperwork at this time. After going over the paperwork any questions, comments, or concerns were answered to the best of this nurse's knowledge. The pt/family verbally acknowledged the teachings/instructions.

## 2024-10-10 NOTE — Discharge Instructions (Signed)
 You may try the over-the-counter lidocaine patches.  You can purchase these without a prescription.  Please call your primary care doctor tomorrow to arrange follow-up appointment.  Return emergency department for any new or worsening symptoms

## 2024-10-16 NOTE — ED Notes (Signed)
 Assumed care of pt. Pt arrived with chief complaint of right leg and groin pain. Pt endorses symptoms for last 30 days with taking methylprednisolone  and norco with no relief of symptoms. Pt alert and oriented x4 with even and unlabored respirations. Pt in recliner in lowest locked position.

## 2024-10-16 NOTE — ED Provider Notes (Signed)
 Initial Provider Assessment  HPI: Pt is a 73 y.o. male with history as listed below who presents to the ED with No chief complaint on file.. R pelvic/groin pain.  History of right sided inguinal hernia repair.  Pain seems to be getting worse.  Unsure if he lifted something heavy.  This may be related to his prior hernia repair consider post herniorrhaphy pain syndrome.   Past Medical History:  Diagnosis Date  . Hyperlipidemia   . Hypertension   . Neck pain     Past Surgical History:  Procedure Laterality Date  . ABDOMINAL SURGERY      ROS: All other systems negative with exception to what is stated above.  Vitals:   10/16/24 1354  BP: (!) 150/70  Pulse: 77  Resp: 18  Temp: 36.1 C (97 F)    Focused Physical Exam: Gen: No acute distress Head: atraumatic, normocephalic Eyes: Extraocular movements grossly intact; conjunctiva clear Pulm: No incr work of breathing  GI: soft, non tender.  TTP in the right inguinal area Neuro: Alert and awake  Medical Decision Making and Plan: Given the patient's initial provider assessment, the following diagnostic evaluation and therapeutic interventions have been ordered. The patient will be placed in the appropriate treatment space, once one is available, to complete the evaluation and treatment.  I have discussed the plan of care with the patient. Labs Reviewed  COMPLETE BLOOD COUNT (CBC) WITH DIFFERENTIAL  COMPREHENSIVE METABOLIC PANEL (CMP)  LIPASE  CULTURE, URINE, ROUTINE WITH PYURIA SCREEN (REFLEXED FROM URINALYSIS)   CT abdomen pelvis with contrast    (Results Pending)   Treatments:  Medications  lactated ringers bolus 1,000 mL (has no administration in time range)  oxyCODONE  (ROXICODONE ) immediate release tablet 2.5 mg (has no administration in time range)      Leiman, Rocky Hampton Pedro, MD 10/16/24 1440

## 2024-10-18 ENCOUNTER — Other Ambulatory Visit: Payer: Self-pay

## 2024-10-18 ENCOUNTER — Emergency Department (HOSPITAL_COMMUNITY)
Admission: EM | Admit: 2024-10-18 | Discharge: 2024-10-18 | Disposition: A | Discharge status: Home / Self Care | Payer: Medicare (Managed Care) | Attending: Emergency Medicine | Admitting: Emergency Medicine

## 2024-10-18 ENCOUNTER — Emergency Department (HOSPITAL_COMMUNITY): Payer: Medicare (Managed Care)

## 2024-10-18 ENCOUNTER — Encounter (HOSPITAL_COMMUNITY): Payer: Self-pay | Admitting: *Deleted

## 2024-10-18 DIAGNOSIS — M1611 Unilateral primary osteoarthritis, right hip: Secondary | ICD-10-CM

## 2024-10-18 DIAGNOSIS — R1031 Right lower quadrant pain: Secondary | ICD-10-CM

## 2024-10-18 DIAGNOSIS — K5900 Constipation, unspecified: Secondary | ICD-10-CM

## 2024-10-18 LAB — BASIC METABOLIC PANEL WITH GFR
Anion gap: 10 (ref 5–15)
BUN: 15 mg/dL (ref 8–23)
CO2: 27 mmol/L (ref 22–32)
Calcium: 8.9 mg/dL (ref 8.9–10.3)
Chloride: 103 mmol/L (ref 98–111)
Creatinine, Ser: 0.81 mg/dL (ref 0.61–1.24)
GFR, Estimated: >60 mL/min (ref >60)
Glucose, Bld: 86 mg/dL (ref 70–99)
Potassium: 4.2 mmol/L (ref 3.5–5.1)
Sodium: 140 mmol/L (ref 135–145)

## 2024-10-18 LAB — CBC WITH DIFFERENTIAL/PLATELET
Abs Immature Granulocytes: 0.02 K/uL (ref 0.00–0.07)
Basophils Absolute: 0.1 K/uL (ref 0.0–0.1)
Basophils Relative: 1 %
Eosinophils Absolute: 0.2 K/uL (ref 0.0–0.5)
Eosinophils Relative: 3 %
HCT: 44 % (ref 39.0–52.0)
Hemoglobin: 14.3 g/dL (ref 13.0–17.0)
Immature Granulocytes: 0 %
Lymphocytes Relative: 20 %
Lymphs Abs: 1.5 K/uL (ref 0.7–4.0)
MCH: 26.5 pg (ref 26.0–34.0)
MCHC: 32.5 g/dL (ref 30.0–36.0)
MCV: 81.5 fL (ref 80.0–100.0)
Monocytes Absolute: 0.5 K/uL (ref 0.1–1.0)
Monocytes Relative: 7 %
Neutro Abs: 5 K/uL (ref 1.7–7.7)
Neutrophils Relative %: 69 %
Platelets: 251 K/uL (ref 150–400)
RBC: 5.4 MIL/uL (ref 4.22–5.81)
RDW: 14.8 % (ref 11.5–15.5)
WBC: 7.3 K/uL (ref 4.0–10.5)
nRBC: 0 % (ref 0.0–0.2)

## 2024-10-18 MED ORDER — IOHEXOL 300 MG/ML  SOLN
100.0000 mL | Freq: Once | INTRAMUSCULAR | Status: AC | PRN
  Administered 2024-10-18: 100 mL via INTRAVENOUS

## 2024-10-18 MED ORDER — ONDANSETRON HCL 4 MG/2ML IJ SOLN
4.0000 mg | Freq: Once | INTRAMUSCULAR | Status: AC
  Administered 2024-10-18: 4 mg via INTRAVENOUS
  Filled 2024-10-18: qty 2

## 2024-10-18 MED ORDER — MORPHINE SULFATE (PF) 4 MG/ML IV SOLN
4.0000 mg | Freq: Once | INTRAVENOUS | Status: AC
  Administered 2024-10-18: 4 mg via INTRAVENOUS
  Filled 2024-10-18: qty 1

## 2024-10-18 MED ORDER — HYDROCODONE-ACETAMINOPHEN 5-325 MG PO TABS: 1.0000 | ORAL_TABLET | Freq: Four times a day (QID) | ORAL | 0 refills | Status: AC | PRN

## 2024-10-18 NOTE — Discharge Instructions (Addendum)
 IMPRESSION:  1. No acute findings in the abdomen or pelvis.  2. 7 mm nonobstructive right kidney lower pole renal calculus.  3. Prominence of stool throughout the colon, favoring constipation.  4. Moderate bilateral hip osteoarthritis with moderate right and mild left  degenerative spurring, and mild lumbar spondylosis and degenerative disc  disease.  5. Dependent subsegmental atelectasis in both lower lobes.  6. Systemic atherosclerosis involving the aorta and iliac arteries.  7. Prior bilateral groin hernia repair.  Above is the report of your CAT scan.  We are prescribing you a short course of pain medication.  For the constipation you should take MiraLAX and a stool softener.  Drink plenty of fluids.  Schedule appointment with your primary care doctor for further evaluation.  Return if any worsening or concerning symptoms

## 2024-10-18 NOTE — ED Triage Notes (Signed)
 Pt BIB [facility] for c/o abdominal pain; pt was seen here last week for same complaint and was told nothing was wrong  Pt went to [facility] this past weekend and was found to have a hernia  Pt was given morphine  6mg  IV en route by ems  BP 170/90 HR 60's

## 2024-10-18 NOTE — ED Provider Notes (Signed)
 [place] EMERGENCY DEPARTMENT AT [facility] Provider Note   CSN: [id number] Arrival date & time: 10/18/24  1037     Patient presents with: Abdominal Pain   Kevin Odom is a 73 y.o. male.  He has been having pain in his right groin area for about a month.  He said he has been seen in [place] and here and a few days ago went to [place].  They thought it may be related to his hernia.  He was supposed to follow-up with his surgeon today but the pain got worse so he took an ambulance here.  He rates it a 7 out of 10 worse with movement.  Does not radiate not associated with nausea vomiting diarrhea constipation or urinary symptoms.  No fevers or chills.  No known trauma.   The history is provided by the patient.  Abdominal Pain Pain location:  RLQ Pain quality: aching   Pain severity:  Severe Onset quality:  Gradual Duration:  5 weeks Timing:  Intermittent Progression:  Unchanged Context: not trauma   Relieved by:  Nothing Associated symptoms: no diarrhea, no dysuria, no fever, no hematemesis, no hematochezia, no hematuria, no nausea and no vomiting        Prior to Admission medications   Medication Sig Start Date End Date Taking? Authorizing Provider  Cholecalciferol (VITAMIN D) 2000 units CAPS Take 1 capsule by mouth daily.    [provider]  HYDROcodone -acetaminophen  (NORCO/VICODIN) 5-325 MG tablet Take one tab po q 4 hrs prn pain 10/10/24   Triplett, Tammy, PA-C  Magnesium  500 MG CAPS Take by mouth 2 (two) times daily.    [provider]  methocarbamol  (ROBAXIN ) 500 MG tablet Take 1 tablet (500 mg total) by mouth 3 (three) times daily. 04/21/23   Triplett, Tammy, PA-C  methylPREDNISolone  (MEDROL  DOSEPAK) 4 MG TBPK tablet Take 6 tablets day one, 5 tablets day two, 4 tablets day three, 3 tablets day four, 2 tablets day five, then 1 tablet day six 10/10/24   Triplett, Tammy, PA-C  verapamil (VERELAN PM) 240 MG 24 hr capsule Take 240 mg by mouth at  bedtime.    [provider]  Vitamin D, Ergocalciferol, (DRISDOL) 50000 units CAPS capsule Take 50,000 Units by mouth every Monday.     [provider]    Allergies: Vancomycin    Review of Systems  Constitutional:  Negative for fever.  Gastrointestinal:  Positive for abdominal pain. Negative for diarrhea, hematemesis, hematochezia, nausea and vomiting.  Genitourinary:  Negative for dysuria and hematuria.    Updated Vital Signs BP (!) 181/89   Pulse 61   Resp 17   Ht 5' 10 (1.778 m)   Wt 77.1 kg   SpO2 97%   BMI 24.39 kg/m   Physical Exam Vitals and nursing note reviewed.  Constitutional:      Appearance: Normal appearance. He is well-developed.  HENT:     Head: Normocephalic and atraumatic.  Eyes:     Conjunctiva/sclera: Conjunctivae normal.  Cardiovascular:     Rate and Rhythm: Normal rate and regular rhythm.     Heart sounds: No murmur heard. Pulmonary:     Effort: Pulmonary effort is normal. No respiratory distress.     Breath sounds: Normal breath sounds.  Abdominal:     Palpations: Abdomen is soft.     Tenderness: There is no abdominal tenderness.  Musculoskeletal:     Cervical back: Neck supple.  Skin:    General: Skin is warm and  dry.  Neurological:     General: No focal deficit present.     Mental Status: He is alert.     GCS: GCS eye subscore is 4. GCS verbal subscore is 5. GCS motor subscore is 6.     Sensory: No sensory deficit.     Motor: No weakness.     (all labs ordered are listed, but only abnormal results are displayed) Labs Reviewed  BASIC METABOLIC PANEL WITH GFR  CBC WITH DIFFERENTIAL/PLATELET  URINALYSIS, ROUTINE W REFLEX MICROSCOPIC    EKG: None  Radiology: CT ABDOMEN PELVIS W CONTRAST Result Date: 10/18/2024 EXAM: CT ABDOMEN AND PELVIS WITH CONTRAST 10/18/2024 01:21:26 PM TECHNIQUE: CT of the abdomen and pelvis was performed with the administration of 100 cc of iohexol  (OMNIPAQUE ) 300 MG/ML solution.  Multiplanar reformatted images are provided for review. Automated exposure control, iterative reconstruction, and/or weight-based adjustment of the mA/kV was utilized to reduce the radiation dose to as low as reasonably achievable. COMPARISON: Abdominal ultrasound 07/02/2016. CLINICAL HISTORY: Right lower quadrant abdominal pain; right groin. FINDINGS: LOWER CHEST: Dependent subsegmental atelectasis in both lower lobes. LIVER: The liver is unremarkable. GALLBLADDER AND BILE DUCTS: Gallbladder is unremarkable. No biliary ductal dilatation. SPLEEN: No acute abnormality. PANCREAS: No acute abnormality. ADRENAL GLANDS: No acute abnormality. KIDNEYS, URETERS AND BLADDER: 7 mm in long axis right kidney lower pole nonobstructive renal calculus. No stones in the ureters. No hydronephrosis. No perinephric or periureteral stranding. Urinary bladder is unremarkable. GI AND BOWEL: Stomach demonstrates no acute abnormality. Prominence of stool throughout the colon, favoring constipation. Normal appendix. No bowel obstruction. PERITONEUM AND RETROPERITONEUM: No ascites. No free air. VASCULATURE: Systemic atherosclerosis is present, including the aorta and iliac arteries. LYMPH NODES: No lymphadenopathy. REPRODUCTIVE ORGANS: No acute abnormality. BONES AND SOFT TISSUES: Prior bilateral groin hernia repair. No recurrent groin hernia. Moderate degenerative chondral thinning with moderate right and mild left degenerative spurring in the hips. Mild lumbar spondylosis and degenerative disc disease. No acute osseous abnormality. No focal soft tissue abnormality. IMPRESSION: 1. No acute findings in the abdomen or pelvis. 2. 7 mm nonobstructive right kidney lower pole renal calculus. 3. Prominence of stool throughout the colon, favoring constipation. 4. Moderate bilateral hip osteoarthritis with moderate right and mild left degenerative spurring, and mild lumbar spondylosis and degenerative disc disease. 5. Dependent subsegmental  atelectasis in both lower lobes. 6. Systemic atherosclerosis involving the aorta and iliac arteries. 7. Prior bilateral groin hernia repair. Electronically signed by: Ryan Salvage MD 10/18/2024 02:06 PM EST RP Workstation: [id number]     Procedures   Medications Ordered in the ED  morphine  (PF) 4 MG/ML injection 4 mg (4 mg Intravenous Given 10/18/24 1138)  ondansetron  (ZOFRAN ) injection 4 mg (4 mg Intravenous Given 10/18/24 1138)  iohexol  (OMNIPAQUE ) 300 MG/ML solution 100 mL (100 mLs Intravenous Contrast Given 10/18/24 1255)                                    Medical Decision Making Amount and/or Complexity of Data Reviewed Labs: ordered. Radiology: ordered.  Risk Prescription drug management.   This patient complains of right groin pain; this involves an extensive number of treatment Options and is a complaint that carries with it a high risk of complications and morbidity. The differential includes musculoskeletal pain, hernia, renal colic, diverticulitis, colitis, appendicitis  I ordered, reviewed and interpreted labs, which included CBC normal chemistries normal I ordered medication IV pain medicine and  nausea medicine and reviewed PMP when indicated. I ordered imaging studies which included CT abdomen and pelvis and I independently    visualized and interpreted imaging which showed significant degenerative changes right hip and lumbar spine.  Stable right inguinal hernia repair.  Moderate constipation. Previous records obtained and reviewed in epic including recent notes from this ED and [facility] Cardiac monitoring reviewed, sinus rhythm Social determinants considered, tobacco use Critical Interventions: None  After the interventions stated above, I reevaluated the patient and found patient's pain to be improved Admission and further testing considered, no indications for admission at this time.  Will outpatient follow-up with PCP and likely orthopedics.  Also should see  his priors hernia surgeon to confirm that that is not the source of his problems.  Return instructions discussed.      Final diagnoses:  Severe right groin pain  Constipation, unspecified constipation type  Arthritis of right hip    ED Discharge Orders          Ordered    HYDROcodone -acetaminophen  (NORCO/VICODIN) 5-325 MG tablet  Every 6 hours PRN        10/18/24 1416               Towana Ozell BROCKS, MD 10/18/24 1725
